# Patient Record
Sex: Female | Born: 1935 | Race: White | Hispanic: No | Smoking: Former smoker
Health system: Southern US, Community
[De-identification: ages and names within clinical notes are randomized; demographics above are authoritative.]

## PROBLEM LIST (undated history)

## (undated) DIAGNOSIS — N3941 Urge incontinence: Secondary | ICD-10-CM

## (undated) DIAGNOSIS — B351 Tinea unguium: Secondary | ICD-10-CM

## (undated) DIAGNOSIS — J40 Bronchitis, not specified as acute or chronic: Secondary | ICD-10-CM

## (undated) DIAGNOSIS — J438 Other emphysema: Secondary | ICD-10-CM

## (undated) DIAGNOSIS — I10 Essential (primary) hypertension: Secondary | ICD-10-CM

## (undated) HISTORY — PX: ABDOMINAL HYSTERECTOMY: SHX81

## (undated) HISTORY — PX: CHOLECYSTECTOMY: SHX55

---

## 2017-10-07 DIAGNOSIS — I10 Essential (primary) hypertension: Secondary | ICD-10-CM | POA: Insufficient documentation

## 2017-10-22 DIAGNOSIS — B351 Tinea unguium: Secondary | ICD-10-CM | POA: Insufficient documentation

## 2017-11-03 ENCOUNTER — Ambulatory Visit (INDEPENDENT_AMBULATORY_CARE_PROVIDER_SITE_OTHER): Payer: Medicare (Managed Care) | Admitting: Podiatry

## 2017-11-03 ENCOUNTER — Encounter: Payer: Self-pay | Admitting: Podiatry

## 2017-11-03 DIAGNOSIS — M79674 Pain in right toe(s): Secondary | ICD-10-CM | POA: Diagnosis not present

## 2017-11-03 DIAGNOSIS — B351 Tinea unguium: Secondary | ICD-10-CM | POA: Diagnosis not present

## 2017-11-03 DIAGNOSIS — M79675 Pain in left toe(s): Secondary | ICD-10-CM

## 2017-11-03 NOTE — Progress Notes (Signed)
   Subjective:    Patient ID: Anne Harrison, female    DOB: 01/18/1924, 84 y.o.   MRN: [id number]  HPIthis patient presents to the office for an evaluation and treatment of her painful  feet.  She [place]  She says the nails are long and thick and are painful walking and wearing her shoes.  She says it has been over 6 months since she was last evaluated and treated.  She says she is unable to self treat. She presents the office for preventative foot care services    Review of Systems  All other systems reviewed and are negative.      Objective:   Physical Exam General Appearance  Alert, conversant and in no acute stress.  Vascular  Dorsalis pedis and posterior pulses are palpable  bilaterally.  Capillary return is within normal limits  bilaterally. Temperature is within normal limits  Bilaterally.  Neurologic  Senn-Weinstein monofilament wire test within normal limits  bilaterally. Muscle power within normal limits bilaterally.  Nails Thick disfigured discolored nails with subungual debris bilaterally from hallux to fifth toes bilaterally. No evidence of bacterial infection or drainage bilaterally.  Orthopedic  No limitations of motion of motion feet bilaterally.  No crepitus or effusions noted.  No bony pathology or digital deformities noted.  Skin  normotropic skin with no porokeratosis noted bilaterally.  No signs of infections or ulcers noted.          Assessment & Plan:  Onychomycosis  B/L     IE  Debride nails  X 10.  RTC prn   Helane GuntherGregory Semaj Kham DPM

## 2018-08-05 DIAGNOSIS — N390 Urinary tract infection, site not specified: Secondary | ICD-10-CM | POA: Insufficient documentation

## 2018-08-05 DIAGNOSIS — N3941 Urge incontinence: Secondary | ICD-10-CM | POA: Insufficient documentation

## 2018-10-13 DIAGNOSIS — R059 Cough, unspecified: Secondary | ICD-10-CM | POA: Insufficient documentation

## 2018-10-13 DIAGNOSIS — J40 Bronchitis, not specified as acute or chronic: Secondary | ICD-10-CM | POA: Insufficient documentation

## 2018-11-27 ENCOUNTER — Emergency Department
Admission: EM | Admit: 2018-11-27 | Discharge: 2018-11-27 | Disposition: A | Discharge status: Skilled Nursing Facility | Payer: Medicare HMO | Attending: Emergency Medicine | Admitting: Emergency Medicine

## 2018-11-27 ENCOUNTER — Emergency Department: Payer: Medicare HMO

## 2018-11-27 ENCOUNTER — Other Ambulatory Visit: Payer: Self-pay

## 2018-11-27 DIAGNOSIS — Y9210 Unspecified residential institution as the place of occurrence of the external cause: Secondary | ICD-10-CM | POA: Insufficient documentation

## 2018-11-27 DIAGNOSIS — Z79899 Other long term (current) drug therapy: Secondary | ICD-10-CM | POA: Insufficient documentation

## 2018-11-27 DIAGNOSIS — I10 Essential (primary) hypertension: Secondary | ICD-10-CM | POA: Insufficient documentation

## 2018-11-27 DIAGNOSIS — W19XXXA Unspecified fall, initial encounter: Secondary | ICD-10-CM | POA: Insufficient documentation

## 2018-11-27 DIAGNOSIS — S0990XA Unspecified injury of head, initial encounter: Secondary | ICD-10-CM | POA: Insufficient documentation

## 2018-11-27 DIAGNOSIS — Y999 Unspecified external cause status: Secondary | ICD-10-CM | POA: Diagnosis not present

## 2018-11-27 DIAGNOSIS — I6203 Nontraumatic chronic subdural hemorrhage: Secondary | ICD-10-CM | POA: Diagnosis not present

## 2018-11-27 DIAGNOSIS — Y939 Activity, unspecified: Secondary | ICD-10-CM | POA: Insufficient documentation

## 2018-11-27 DIAGNOSIS — R51 Headache: Secondary | ICD-10-CM | POA: Diagnosis present

## 2018-11-27 DIAGNOSIS — Z87891 Personal history of nicotine dependence: Secondary | ICD-10-CM | POA: Diagnosis not present

## 2018-11-27 LAB — CBC WITH DIFFERENTIAL/PLATELET
Abs Immature Granulocytes: 0.04 10*3/uL (ref 0.00–0.07)
Basophils Absolute: 0 10*3/uL (ref 0.0–0.1)
Basophils Relative: 0 %
Eosinophils Absolute: 0 10*3/uL (ref 0.0–0.5)
Eosinophils Relative: 0 %
HCT: 41.3 % (ref 36.0–46.0)
Hemoglobin: 13.5 g/dL (ref 12.0–15.0)
Immature Granulocytes: 1 %
Lymphocytes Relative: 2 %
Lymphs Abs: 0.2 10*3/uL — ABNORMAL LOW (ref 0.7–4.0)
MCH: 30.5 pg (ref 26.0–34.0)
MCHC: 32.7 g/dL (ref 30.0–36.0)
MCV: 93.2 fL (ref 80.0–100.0)
Monocytes Absolute: 0.7 10*3/uL (ref 0.1–1.0)
Monocytes Relative: 10 %
Neutro Abs: 6.3 10*3/uL (ref 1.7–7.7)
Neutrophils Relative %: 87 %
Platelets: 166 10*3/uL (ref 150–400)
RBC: 4.43 MIL/uL (ref 3.87–5.11)
RDW: 13.8 % (ref 11.5–15.5)
WBC: 7.2 10*3/uL (ref 4.0–10.5)
nRBC: 0 % (ref 0.0–0.2)

## 2018-11-27 LAB — PROTIME-INR
INR: 0.95
Prothrombin Time: 12.6 seconds (ref 11.4–15.2)

## 2018-11-27 LAB — APTT: APTT: 43 s — AB (ref 24–36)

## 2018-11-27 NOTE — ED Notes (Signed)
Patient transported to X-ray 

## 2018-11-27 NOTE — ED Provider Notes (Addendum)
[facility] Emergency Department Provider Note  ____________________________________________   I have reviewed the triage vital signs and the nursing notes. Where available I have reviewed prior notes and, if possible and indicated, outside hospital notes.    HISTORY  Chief Complaint Fall    HPI Anne Harrison is a 85 y.o. female  who is not on blood thinners, had a non-syncopal fall.  Patient does have a history of a shunt, it was thought that she had a hematoma in the back of her head but it turns out that is where her shunt is.  She is at her baseline according to family.  She may have bumped her head, she does have chronic hip pain from exercises she has been doing, but nothing from the fall.  Family feels that she is "fine" but they do agree to imaging.  She did not have a syncopal event, she tripped.  Remembers falling remembers hitting the ground does not endorse passing out.  No other alleviating or aggravating symptoms no other prior treatment no other complaints     History reviewed. No pertinent past medical history.  Patient Active Problem List   Diagnosis Date Noted  . Onychomycosis 10/22/2017  . Essential hypertension 10/07/2017    History reviewed. No pertinent surgical history.  Prior to Admission medications   Medication Sig Start Date End Date Taking? Authorizing Provider  acetaminophen (TYLENOL) 325 MG tablet Take 325 mg by mouth every 4 (four) hours as needed for fever.   Yes [provider]  amLODipine (NORVASC) 5 MG tablet Take 5 mg by mouth daily.  10/22/17 11/27/18 Yes [provider]  Calcium-Phosphorus-Vitamin D (CITRACAL +D3) 250-107-500 MG-MG-UNIT CHEW Chew by mouth daily.   Yes [provider]  D-Mannose 500 MG CAPS Take 2 capsules by mouth at bedtime.   Yes [provider]  loperamide (ANTI-DIARRHEAL) 2 MG tablet Take 2 mg by mouth 4 (four) times daily as needed for diarrhea or loose stools.    Yes [provider]  metoprolol succinate (TOPROL-XL) 25 MG 24 hr tablet Take 25 mg by mouth at bedtime.  10/22/17 11/27/18 Yes [provider]  Multiple Vitamin (MULTIVITAMIN) capsule Take 2 capsules by mouth daily.    Yes [provider]    Allergies Ciprofloxacin  History reviewed. No pertinent family history.  Social History Social History   Tobacco Use  . Smoking status: Former Games developermoker  . Smokeless tobacco: Never Used  Substance Use Topics  . Alcohol use: Yes    Alcohol/week: 1.0 - 2.0 standard drinks    Types: 1 - 2 Glasses of wine per week    Comment: 1-2 glasses of wine per day  . Drug use: No    Review of Systems Constitutional: No fever/chills Eyes: No visual changes. ENT: No sore throat. No stiff neck no neck pain Cardiovascular: Denies chest pain. Respiratory: Denies shortness of breath. Gastrointestinal:   no vomiting.  No diarrhea.  No constipation. Genitourinary: Negative for dysuria. Musculoskeletal: Negative lower extremity swelling Skin: Negative for rash. Neurological: Negative for severe headaches, focal weakness or numbness.   ____________________________________________   PHYSICAL EXAM:  VITAL SIGNS: ED Triage Vitals  Enc Vitals Group     BP 11/27/18 1526 (!) 157/93     Pulse Rate 11/27/18 1526 93     Resp 11/27/18 1526 20     Temp 11/27/18 1526 98.6 F (37 C)     Temp Source 11/27/18 1526 Oral     SpO2 11/27/18  1526 96 %     Weight 11/27/18 1528 122 lb (55.3 kg)     Height 11/27/18 1528 5\' 4"  (1.626 m)     Head Circumference --      Peak Flow --      Pain Score 11/27/18 1528 5     Pain Loc --      Pain Edu? --      Excl. in GC? --     Constitutional: Alert and oriented. Well appearing and in no acute distress. Eyes: Conjunctivae are normal Head: Atraumatic, there is a shunt palpated but no trauma HEENT: No congestion/rhinnorhea. Mucous membranes are moist.  Oropharynx non-erythematous Neck:   Nontender  with no meningismus, no masses, no stridor Cardiovascular: Normal rate, regular rhythm. Grossly normal heart sounds.  Good peripheral circulation. Respiratory: Normal respiratory effort.  No retractions. Lungs CTAB. Abdominal: Soft and nontender. No distention. No guarding no rebound Back:  There is no focal tenderness or step off.  there is no midline tenderness there are no lesions noted. there is no CVA tenderness Musculoskeletal: No lower extremity tenderness, no upper extremity tenderness. No joint effusions, no DVT signs strong distal pulses no edema Neurologic:  Normal speech and language. No gross focal neurologic deficits are appreciated.  Skin:  Skin is warm, dry and intact. No rash noted. Psychiatric: Mood and affect are normal. Speech and behavior are normal.  ____________________________________________   LABS (all labs ordered are listed, but only abnormal results are displayed)  Labs Reviewed  CBC WITH DIFFERENTIAL/PLATELET - Abnormal; Notable for the following components:      Result Value   Lymphs Abs 0.2 (*)    All other components within normal limits  APTT - Abnormal; Notable for the following components:   aPTT 43 (*)    All other components within normal limits  PROTIME-INR    Pertinent labs  results that were available during my care of the patient were reviewed by me and considered in my medical decision making (see chart for details). ____________________________________________  EKG  I personally interpreted any EKGs ordered by me or triage  ____________________________________________  RADIOLOGY  Pertinent labs & imaging results that were available during my care of the patient were reviewed by me and considered in my medical decision making (see chart for details). If possible, patient and/or family made aware of any abnormal findings.  Ct Head Wo Contrast  Result Date: 11/27/2018 CLINICAL DATA:  Status post fall with a blow to the back of the  head. EXAM: CT HEAD WITHOUT CONTRAST TECHNIQUE: Contiguous axial images were obtained from the base of the skull through the vertex without intravenous contrast. COMPARISON:  None. FINDINGS: Brain: Patient's extra-axial fluid collection on the left measuring 0.8 cm in thickness. The collection is low attenuating but slightly more dense than CSF most consistent with a chronic subdural hematoma. No acute hemorrhage, infarct, midline shift or mass lesion is identified. Right parietal approach ventriculostomy shunt catheter is in place. No hydrocephalus. Atrophy and chronic microvascular ischemic change are noted. Vascular: Atherosclerosis is identified. Skull: Intact.  No focal lesion. Sinuses/Orbits: No acute abnormality paranasal sinuses clear. Other: None. IMPRESSION: No acute intracranial normality. Small, chronic left subdural hematoma without midline shift. Atrophy and chronic microvascular ischemic change. Ventriculostomy shunt catheter place.  Negative for hydrocephalus. Atherosclerosis. Electronically Signed   By: Drusilla Kanner M.D.   On: 11/27/2018 16:11   Dg Hip Unilat W Or Wo Pelvis 2-3 Views Left  Result Date: 11/27/2018 CLINICAL DATA:  Left  hip pain since beginning an exercise program. EXAM: DG HIP (WITH OR WITHOUT PELVIS) 2-3V LEFT COMPARISON:  None. FINDINGS: There is no evidence of hip fracture or dislocation. There is no evidence of arthropathy or other focal bone abnormality. IMPRESSION: Negative exam. Electronically Signed   By: Drusilla Kannerhomas  Dalessio M.D.   On: 11/27/2018 16:18   ____________________________________________    PROCEDURES  Procedure(s) performed: None  Procedures  Critical Care performed: None  ____________________________________________   INITIAL IMPRESSION / ASSESSMENT AND PLAN / ED COURSE  Pertinent labs & imaging results that were available during my care of the patient were reviewed by me and considered in my medical decision making (see chart for  details).  Well-appearing patient after a non-syncopal fall, CT scan shows a questionable hygroma, versus old bleed but no evidence of acute bleed.  For this reason, I did do a CT basic blood work, to make sure her platelets etc. are okay which they are.  I will discuss with neurosurgery but is my hope that we can get her safely home.  Pending neurosurgical consult  ----------------------------------------- 6:23 PM on 11/27/2018 -----------------------------------------  Patient remains alert and awake, I did discuss all of her findings with Dr. Renee RivalHouck, of neurosurgery, he does not feel further imaging or work-up is important at this time but he does recommend outpatient follow-up as a PRN basis.  We will do so.  Family and she are eager to go home we will discharge.  Remains neurologically intact return precautions were given and understood.    ____________________________________________   FINAL CLINICAL IMPRESSION(S) / ED DIAGNOSES  Final diagnoses:  None      This chart was dictated using voice recognition software.  Despite best efforts to proofread,  errors can occur which can change meaning.      Jeanmarie PlantMcShane, Parilee Hally A, MD 11/27/18 1742    Jeanmarie PlantMcShane, Soley Harriss A, MD 11/27/18 [phone]

## 2018-11-27 NOTE — ED Triage Notes (Signed)
Pt arrived via [facility] from [facility] assisted living with a mechanical fall. Pt has hematoma to the right back of her head and is c/o of some left hip tenderness. No LOC and no blood thinners. Pt pleasant and calm upon arrival.

## 2018-11-27 NOTE — Discharge Instructions (Addendum)
There may be a trace of old, not active blood on your CT scan.  Please follow closely with Dr. listed above.  Avoid anticoagulation medications which would include aspirin and ibuprofen, until they see you.  If you have severe headache numbness or weakness or other complaints or concerns return to the emergency department

## 2019-12-28 ENCOUNTER — Encounter: Payer: Self-pay | Admitting: Intensive Care

## 2019-12-28 ENCOUNTER — Emergency Department
Admission: EM | Admit: 2019-12-28 | Discharge: 2019-12-28 | Disposition: A | Discharge status: Home / Self Care | Payer: Medicare Other | Attending: Student | Admitting: Student

## 2019-12-28 ENCOUNTER — Emergency Department: Payer: Medicare Other

## 2019-12-28 ENCOUNTER — Other Ambulatory Visit: Payer: Self-pay

## 2019-12-28 DIAGNOSIS — M25551 Pain in right hip: Secondary | ICD-10-CM | POA: Insufficient documentation

## 2019-12-28 DIAGNOSIS — W19XXXA Unspecified fall, initial encounter: Secondary | ICD-10-CM

## 2019-12-28 DIAGNOSIS — W010XXA Fall on same level from slipping, tripping and stumbling without subsequent striking against object, initial encounter: Secondary | ICD-10-CM | POA: Diagnosis not present

## 2019-12-28 DIAGNOSIS — Z79899 Other long term (current) drug therapy: Secondary | ICD-10-CM | POA: Diagnosis not present

## 2019-12-28 DIAGNOSIS — Z87891 Personal history of nicotine dependence: Secondary | ICD-10-CM | POA: Diagnosis not present

## 2019-12-28 DIAGNOSIS — I10 Essential (primary) hypertension: Secondary | ICD-10-CM | POA: Diagnosis not present

## 2019-12-28 DIAGNOSIS — R519 Headache, unspecified: Secondary | ICD-10-CM | POA: Diagnosis not present

## 2019-12-28 HISTORY — DX: Urge incontinence: N39.41

## 2019-12-28 HISTORY — DX: Essential (primary) hypertension: I10

## 2019-12-28 HISTORY — DX: Other emphysema: J43.8

## 2019-12-28 HISTORY — DX: Bronchitis, not specified as acute or chronic: J40

## 2019-12-28 HISTORY — DX: Tinea unguium: B35.1

## 2019-12-28 NOTE — ED Provider Notes (Signed)
[facility] Emergency Department Provider Note  ____________________________________________   First MD Initiated Contact with Patient 12/28/19 1408     (approximate)  I have reviewed the triage vital signs and the nursing notes.  History  Chief Complaint Fall and Hip Pain    HPI Anne Harrison is a 86 y.o. female who presents from her independent living facility for a fall. Patient states she was in the restroom reaching for some soap when she slipped and fell to the ground. Landed on her right side. Was able to get up and ambulate afterwards. Complains of primarily of right hip pain and mild right forehead/side head pain. Pain located to the lateral R hip area, sharp/aching, 8/10 in severity. No radiation. No alleviating or aggravating components. Denies any LOC. Not on any anticoagulation. Denies any chest pain, palpitations, dizziness, or shortness of breath. No LE weakness, numbness, tingling. No sick contacts.   Does have hx of chronic left subdural hematoma. 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 No acute changes with regards to either per daughter (discussed via phone).    Past Medical Hx Past Medical History:  Diagnosis Date  . Bronchitis, not specified as acute or chronic   . Essential (primary) hypertension   . Other emphysema ([place])   . Tinea unguium   . Urge incontinence     Problem List Patient Active Problem List   Diagnosis Date Noted  . Onychomycosis 10/22/2017  . Essential hypertension 10/07/2017    Past Surgical Hx History reviewed. No pertinent surgical history.  Medications Prior to Admission medications   Medication Sig Start Date End Date Taking? Authorizing Provider  acetaminophen (TYLENOL) 325 MG tablet Take 325 mg by mouth every 4 (four) hours as needed for fever.    [provider]  amLODipine (NORVASC) 5 MG tablet Take 5 mg by mouth daily.  10/22/17 11/27/18  [provider]   Calcium-Phosphorus-Vitamin D ([place] +D3) [phone] MG-MG-UNIT CHEW Chew by mouth daily.    [provider]  D-Mannose 500 MG CAPS Take 2 capsules by mouth at bedtime.    [provider]  loperamide (ANTI-DIARRHEAL) 2 MG tablet Take 2 mg by mouth 4 (four) times daily as needed for diarrhea or loose stools.    [provider]  metoprolol succinate (TOPROL-XL) 25 MG 24 hr tablet Take 25 mg by mouth at bedtime.  10/22/17 11/27/18  [provider]  Multiple Vitamin (MULTIVITAMIN) capsule Take 2 capsules by mouth daily.     [provider]    Allergies Ciprofloxacin  Family Hx History reviewed. No pertinent family history.  Social Hx Social History   Tobacco Use  . Smoking status: Former Research scientist (life sciences)  . Smokeless tobacco: Never Used  Substance Use Topics  . Alcohol use: Yes    Alcohol/week: 1.0 - 2.0 standard drinks    Types: 1 - 2 Glasses of wine per week    Comment: 1-2 glasses of wine per day  . Drug use: No     Review of Systems  Constitutional: Negative for fever, chills. + R forehead pain Eyes: Negative for visual changes. ENT: Negative for sore throat. Cardiovascular: Negative for chest pain. Respiratory: Negative for shortness of breath. Gastrointestinal: Negative for nausea, vomiting.  Genitourinary: Negative for dysuria. Musculoskeletal: + R hip pain Skin: Negative for rash. Neurological: Negative for headaches.   Physical Exam  Vital Signs: ED Triage Vitals  Enc Vitals Group     BP 12/28/19  1400 (!) 178/104     Pulse Rate 12/28/19 1400 75     Resp 12/28/19 1400 18     Temp 12/28/19 1407 98.2 F (36.8 C)     Temp Source 12/28/19 1407 Oral     SpO2 12/28/19 1400 97 %     Weight 12/28/19 1400 110 lb (49.9 kg)     Height 12/28/19 1400 5\' 4"  (1.626 m)     Head Circumference --      Peak Flow --      Pain Score 12/28/19 1400 8     Pain Loc --      Pain Edu? --      Excl. in GC? --     Constitutional: Alert and  oriented.  Head: Normocephalic.  No appreciable large hematomas or abrasions or lacerations. Eyes: Conjunctivae clear. Sclera anicteric. Nose: No epistaxis. Mouth/Throat: Wearing mask.  Neck: No stridor.  No midline CS tenderness. Cardiovascular: Normal rate, regular rhythm. Extremities well perfused. Chest: Chest wall stable, nontender.  No crepitance or step-offs. Respiratory: Normal respiratory effort.  Lungs CTAB. Gastrointestinal: Soft. Non-tender. Non-distended.  Pelvis: Pelvis is stable with AP and lateral compression.  FROM bilateral hips.  TTP right lateral hip over the greater trochanter area. Musculoskeletal: No lower extremity edema. No deformities.  FROM bilateral shoulders, elbows, wrists, hips, knees, ankles. Neurologic:  Normal speech and language. No gross focal neurologic deficits are appreciated.  Skin: Skin is warm, dry and intact. No lacerations or abrasions. Psychiatric: Mood and affect are appropriate for situation.  EKG  Personally reviewed.   Rate: 66 Rhythm: sinus Axis: normal Intervals: WNL No acute ischemic changes No STEMI    Radiology  CXR: IMPRESSION:  No active cardiopulmonary disease.   XR R hip: IMPRESSION:  Negative.   CT head: IMPRESSION:  No evidence of acute intracranial injury.  Stable positioning of shunt catheter. Ventricle caliber has slightly  increased.  Decrease in overall size of chronic left cerebral convexity subdural  collection.  Stable chronic microvascular ischemic changes.   CT CS: IMPRESSION:  No acute cervical spine fracture.    Procedures  Procedure(s) performed (including critical care):  Procedures   Initial Impression / Assessment and Plan / ED Course  86 y.o. female who presents to the ED for fall, as above. Complains of R hip and R forehead pain.  Will obtain imaging to r/o any acute traumatic injuries.  XR and CT imaging negative for acute traumatic injuries. Decreased in size of chronic  subdural. Stable shunt catheter. Discussed results with patient, as well as daughter via phone. Will plan for ambulatory trial and then discharge presuming no issues.    Final Clinical Impression(s) / ED Diagnosis  Final diagnoses:  Fall, initial encounter       Note:  This document was prepared using Dragon voice recognition software and may include unintentional dictation errors.   11-23-1968., MD 12/28/19 [phone]

## 2019-12-28 NOTE — Discharge Instructions (Signed)
Thank you for letting us take care of you in the emergency department today.   Please continue to take any regular, prescribed medications.   Please follow up with: - Your primary care doctor to review your ER visit  Please return to the ER for any new or worsening symptoms.

## 2019-12-28 NOTE — ED Triage Notes (Signed)
PAtient arrived by EMS from [facility] assisted living for unwitnessed fall. Patient c/o right head pain and right hip pain.

## 2019-12-28 NOTE — ED Notes (Signed)
Pt returning home via POV with daughter. Discharge papers and DNR form given to daughter.

## 2019-12-28 NOTE — ED Notes (Signed)
Pt ambulated with walker to bathroom. Pt had been incontinent of urine in bed. Linen changed. Pt provided dry brief. Pt urinated into toilet and ambulated back with walker. Pt dressed and asking when she can leave.

## 2019-12-28 NOTE — ED Notes (Signed)
Pt transported to Xray. 

## 2020-02-20 ENCOUNTER — Emergency Department: Payer: Medicare Other

## 2020-02-20 ENCOUNTER — Other Ambulatory Visit: Payer: Self-pay

## 2020-02-20 ENCOUNTER — Emergency Department
Admission: EM | Admit: 2020-02-20 | Discharge: 2020-02-20 | Disposition: A | Discharge status: Skilled Nursing Facility | Payer: Medicare Other | Attending: Emergency Medicine | Admitting: Emergency Medicine

## 2020-02-20 DIAGNOSIS — I10 Essential (primary) hypertension: Secondary | ICD-10-CM | POA: Insufficient documentation

## 2020-02-20 DIAGNOSIS — S0990XA Unspecified injury of head, initial encounter: Secondary | ICD-10-CM | POA: Diagnosis present

## 2020-02-20 DIAGNOSIS — Y999 Unspecified external cause status: Secondary | ICD-10-CM | POA: Diagnosis not present

## 2020-02-20 DIAGNOSIS — Y92129 Unspecified place in nursing home as the place of occurrence of the external cause: Secondary | ICD-10-CM | POA: Diagnosis not present

## 2020-02-20 DIAGNOSIS — W19XXXA Unspecified fall, initial encounter: Secondary | ICD-10-CM

## 2020-02-20 DIAGNOSIS — W01198A Fall on same level from slipping, tripping and stumbling with subsequent striking against other object, initial encounter: Secondary | ICD-10-CM | POA: Insufficient documentation

## 2020-02-20 DIAGNOSIS — Z87891 Personal history of nicotine dependence: Secondary | ICD-10-CM | POA: Diagnosis not present

## 2020-02-20 DIAGNOSIS — Y9389 Activity, other specified: Secondary | ICD-10-CM | POA: Insufficient documentation

## 2020-02-20 DIAGNOSIS — G912 (Idiopathic) normal pressure hydrocephalus: Secondary | ICD-10-CM | POA: Diagnosis not present

## 2020-02-20 NOTE — ED Notes (Signed)
Pt taken to CT.

## 2020-02-20 NOTE — ED Provider Notes (Signed)
[facility] Emergency Department Provider Note   ____________________________________________   First MD Initiated Contact with Patient 02/20/20 1817     (approximate)  I have reviewed the triage vital signs and the nursing notes.   HISTORY  Chief Complaint Fall    HPI Anne Harrison is a 86 y.o. female with past medical history of normal pressure hydrocephalus status post VP shunt who presents to the ED following fall.  Patient reports that she was going to rise from a seated position when she lost her balance, falling backwards and hitting her head.  She denies losing consciousness and has been feeling well since the fall, denies any headache or other pain.  She also denies any vision changes, speech changes, numbness, or weakness.  She does not take any blood thinners.        Past Medical History:  Diagnosis Date  . Bronchitis, not specified as acute or chronic   . Essential (primary) hypertension   . Other emphysema ([facility])   . Tinea unguium   . Urge incontinence     Patient Active Problem List   Diagnosis Date Noted  . Onychomycosis 10/22/2017  . Essential hypertension 10/07/2017    History reviewed. No pertinent surgical history.  Prior to Admission medications   Medication Sig Start Date End Date Taking? Authorizing Provider  acetaminophen (TYLENOL) 325 MG tablet Take 325 mg by mouth every 4 (four) hours as needed for fever.    [provider]  amLODipine (NORVASC) 5 MG tablet Take 5 mg by mouth daily.  10/22/17 11/27/18  [provider]  Calcium-Phosphorus-Vitamin D (CITRACAL +D3) 250-107-500 MG-MG-UNIT CHEW Chew by mouth daily.    [provider]  D-Mannose 500 MG CAPS Take 2 capsules by mouth at bedtime.    [provider]  loperamide (ANTI-DIARRHEAL) 2 MG tablet Take 2 mg by mouth 4 (four) times daily as needed for diarrhea or loose stools.    [provider]  metoprolol succinate  (TOPROL-XL) 25 MG 24 hr tablet Take 25 mg by mouth at bedtime.  10/22/17 11/27/18  [provider]  Multiple Vitamin (MULTIVITAMIN) capsule Take 2 capsules by mouth daily.     [provider]    Allergies Ciprofloxacin  No family history on file.  Social History Social History   Tobacco Use  . Smoking status: Former Games developer  . Smokeless tobacco: Never Used  Substance Use Topics  . Alcohol use: Yes    Alcohol/week: 1.0 - 2.0 standard drinks    Types: 1 - 2 Glasses of wine per week    Comment: 1-2 glasses of wine per day  . Drug use: No    Review of Systems  Constitutional: No fever/chills.  Positive for fall. Eyes: No visual changes. ENT: No sore throat. Cardiovascular: Denies chest pain. Respiratory: Denies shortness of breath. Gastrointestinal: No abdominal pain.  No nausea, no vomiting.  No diarrhea.  No constipation. Genitourinary: Negative for dysuria. Musculoskeletal: Negative for back pain. Skin: Negative for rash. Neurological: Negative for headaches, focal weakness or numbness.  ____________________________________________   PHYSICAL EXAM:  VITAL SIGNS: ED Triage Vitals [02/20/20 1817]  Enc Vitals Group     BP      Pulse      Resp      Temp      Temp src      SpO2      Weight 89 lb (40.4 kg)     Height 5\' 4"  (1.626 m)  Head Circumference      Peak Flow      Pain Score      Pain Loc      Pain Edu?      Excl. in [place]?     Constitutional: Alert and oriented. Eyes: Conjunctivae are normal. Head: Atraumatic. Nose: No congestion/rhinnorhea. Mouth/Throat: Mucous membranes are moist. Neck: Normal ROM Cardiovascular: Normal rate, regular rhythm. Grossly normal heart sounds. Respiratory: Normal respiratory effort.  No retractions. Lungs CTAB. Gastrointestinal: Soft and nontender. No distention. Genitourinary: deferred Musculoskeletal: No lower extremity tenderness nor edema. Neurologic:  Normal speech and language. No gross focal  neurologic deficits are appreciated. Skin:  Skin is warm, dry and intact. No rash noted. Psychiatric: Mood and affect are normal. Speech and behavior are normal.  ____________________________________________   LABS (all labs ordered are listed, but only abnormal results are displayed)  Labs Reviewed - No data to display ____________________________________________  EKG  ED ECG REPORT I, Blake Divine, the attending physician, personally viewed and interpreted this ECG.   Date: 02/20/2020  EKG Time: 18:19  Rate: 76  Rhythm: normal sinus rhythm  Axis: Normal  Intervals:none  ST&T Change: None   PROCEDURES  Procedure(s) performed (including Critical Care):  Procedures   ____________________________________________   INITIAL IMPRESSION / ASSESSMENT AND PLAN / ED COURSE       86 year old female with history of normal pressure hydrocephalus status post VP shunt presents to the ED for fall where she fell backwards and struck her head.  She is at her baseline mental status with no focal neurologic deficits.  CT scan was obtained and negative for acute process, shows appropriate positioning of shunt.  Patient continues to deny any complaints and is appropriate for discharge home.      ____________________________________________   FINAL CLINICAL IMPRESSION(S) / ED DIAGNOSES  Final diagnoses:  Fall, initial encounter  NPH (normal pressure hydrocephalus) [facility])     ED Discharge Orders    None       Note:  This document was prepared using Dragon voice recognition software and may include unintentional dictation errors.   Blake Divine, MD 02/20/20 [phone]

## 2020-02-20 NOTE — ED Triage Notes (Signed)
Pt brought to ED via [facility] from [facility] for mechanical fall over walker onto floor. Pt hit head with fall, no obvious injury. Pt denies pain. Denies neck pain.

## 2020-07-09 ENCOUNTER — Ambulatory Visit
Admission: EM | Admit: 2020-07-09 | Discharge: 2020-07-09 | Disposition: A | Discharge status: Home / Self Care | Payer: Medicare Other | Attending: Emergency Medicine | Admitting: Emergency Medicine

## 2020-07-09 ENCOUNTER — Ambulatory Visit (INDEPENDENT_AMBULATORY_CARE_PROVIDER_SITE_OTHER): Payer: Medicare Other

## 2020-07-09 ENCOUNTER — Other Ambulatory Visit: Payer: Self-pay

## 2020-07-09 DIAGNOSIS — S7002XA Contusion of left hip, initial encounter: Secondary | ICD-10-CM | POA: Diagnosis not present

## 2020-07-09 DIAGNOSIS — R079 Chest pain, unspecified: Secondary | ICD-10-CM

## 2020-07-09 DIAGNOSIS — S298XXA Other specified injuries of thorax, initial encounter: Secondary | ICD-10-CM

## 2020-07-09 DIAGNOSIS — W19XXXA Unspecified fall, initial encounter: Secondary | ICD-10-CM

## 2020-07-09 DIAGNOSIS — M25559 Pain in unspecified hip: Secondary | ICD-10-CM

## 2020-07-09 DIAGNOSIS — R0781 Pleurodynia: Secondary | ICD-10-CM

## 2020-07-09 DIAGNOSIS — M25552 Pain in left hip: Secondary | ICD-10-CM

## 2020-07-09 MED ORDER — ACETAMINOPHEN 500 MG PO TABS
500.0000 mg | ORAL_TABLET | Freq: Once | ORAL | Status: AC
  Administered 2020-07-09: 500 mg via ORAL

## 2020-07-09 NOTE — ED Triage Notes (Signed)
Patient presents to [facility] with daughter. States that patient fell around 1230pm outside her room at [facility] and fell onto a carpeted floor. Patient now having left hip pain and left leg pain. Patient states that she did hit her head but that doesn't hurt.

## 2020-07-09 NOTE — Discharge Instructions (Addendum)
Her hip x-ray is negative for fracture or dislocation and her chest x-ray is negative for rib fractures or collapsed lung.  I suspect that she is just very sore from the fall.  May give 500 mg of Tylenol 3-4 times a day as needed for pain.  Ice to the areas of pain the first 72 hours, then warmth thereafter.

## 2020-07-09 NOTE — ED Provider Notes (Signed)
HPI  SUBJECTIVE:  Anne Harrison is a 86 y.o. female who presents with left hip pain after having a witnessed mechanical fall this afternoon at 1230.  Daughter states that the patient was walking out of the bathroom with her pants around her ankles, lost her balance, fell landing on her left leg, hip, and left chest.  Patient does not exactly remember how she fell.daughter reports that she "brushed" her left face on the floor.  She did not not hit her head.  No loss of consciousness, altered mental status, headache, facial droop, slurred speech, arm or leg weakness.  No preceding chest pain, shortness of breath, palpitations, syncope causing her to fall.  She denies knee pain.  She reports limitation of motion of the left leg.  She can bear some weight on it although patient states that it is painful.  Patient has a past medical history of hypertension, UTI, "memory issues".  No history of dementia, Alzheimer's, osteoporosis, liver disease.  Patient with memory problems.  Most history obtained from daughter.   Past Medical History:  Diagnosis Date  . Bronchitis, not specified as acute or chronic   . Essential (primary) hypertension   . Other emphysema (HCC)   . Tinea unguium   . Urge incontinence     History reviewed. No pertinent surgical history.  History reviewed. No pertinent family history.  Social History   Tobacco Use  . Smoking status: Former Games developer  . Smokeless tobacco: Never Used  Vaping Use  . Vaping Use: Never used  Substance Use Topics  . Alcohol use: Yes    Alcohol/week: 1.0 - 2.0 standard drink    Types: 1 - 2 Glasses of wine per week    Comment: 1-2 glasses of wine per day  . Drug use: No    No current facility-administered medications for this encounter.  Current Outpatient Medications:  .  amLODipine (NORVASC) 5 MG tablet, Take 5 mg by mouth daily. , Disp: , Rfl:  .  Calcium-Phosphorus-Vitamin D (CITRACAL +D3) 250-107-500 MG-MG-UNIT CHEW, Chew by mouth  daily., Disp: , Rfl:  .  D-Mannose 500 MG CAPS, Take 2 capsules by mouth at bedtime., Disp: , Rfl:  .  loperamide (ANTI-DIARRHEAL) 2 MG tablet, Take 2 mg by mouth 4 (four) times daily as needed for diarrhea or loose stools., Disp: , Rfl:  .  metoprolol succinate (TOPROL-XL) 25 MG 24 hr tablet, Take 25 mg by mouth at bedtime. , Disp: , Rfl:  .  Multiple Vitamin (MULTIVITAMIN) capsule, Take 2 capsules by mouth daily. , Disp: , Rfl:  .  acetaminophen (TYLENOL) 325 MG tablet, Take 325 mg by mouth every 4 (four) hours as needed for fever., Disp: , Rfl:   Allergies  Allergen Reactions  . Ciprofloxacin Hives     ROS  As noted in HPI.   Physical Exam  BP (!) 147/106 (BP Location: Left Arm)   Pulse 88   Temp 98.7 F (37.1 C) (Oral)   Resp 18   Wt 40 kg   SpO2 93%   BMI 15.14 kg/m   Constitutional: Well developed, well nourished, appears uncomfortable Eyes: PERRL, EOMI, conjunctiva normal bilaterally HENT: Normocephalic, atraumatic,mucus membranes moist.  No hemotympanum. Respiratory: Clear to auscultation bilaterally, no rales, no wheezing, no rhonchi.  Positive left-sided chest wall tenderness lateral breast.  No bruising. Cardiovascular: Normal rate and rhythm, no murmurs, no gallops, no rubs GI: Soft, nondistended, normal bowel sounds, nontender, no rebound, no guarding Back: no C spine, T-spine, L-spine tenderness.  skin: No rash, skin intact Musculoskeletal: Legs symmetric length, left leg is in neutral position.  No bruising left hip.  Positive tenderness over greater trochanter.  No bruising.  Positive limited active range of motion at the hip.  Pain with passive range of motion of the hip.  No tenderness over the femur, knee.  Pelvis stable, nontender.  Sensation distal left lower extremity intact.  PT 2+. Neurologic: Alert, CN III-XII grossly intact, no motor deficits, sensation grossly intact Psychiatric: Speech and behavior appropriate   ED Course   Medications   acetaminophen (TYLENOL) tablet 500 mg (500 mg Oral Given 07/09/20 1625)    Orders Placed This Encounter  Procedures  . DG Hip Unilat With Pelvis 2-3 Views Left    Standing Status:   Standing    Number of Occurrences:   1    Order Specific Question:   Symptom/Reason for Exam    Answer:   Hip pain [[id number]]    Order Specific Question:   Radiology Contrast Protocol - do NOT remove file path    Answer:   [email or web address]  . DG Ribs Unilateral W/Chest Left    Standing Status:   Standing    Number of Occurrences:   1    Order Specific Question:   Reason for Exam (SYMPTOM  OR DIAGNOSIS REQUIRED)    Answer:   fall mid chest tenderness   No results found for this or any previous visit (from the past 24 hour(s)). DG Ribs Unilateral W/Chest Left  Result Date: 07/09/2020 CLINICAL DATA:  Pain status post fall EXAM: LEFT RIBS AND CHEST - 3+ VIEW COMPARISON:  December 28, 2019 FINDINGS: A right-sided VP shunt is partially visualized. There is no displaced rib fracture. No pneumothorax. The heart size is unremarkable. Aortic calcifications are noted. IMPRESSION: No displaced rib fracture. No pneumothorax. Electronically Signed   By: Katherine Mantle M.D.   On: 07/09/2020 16:24   DG Hip Unilat With Pelvis 2-3 Views Left  Result Date: 07/09/2020 CLINICAL DATA:  Pain EXAM: DG HIP (WITH OR WITHOUT PELVIS) 2-3V LEFT COMPARISON:  November 27, 2018 FINDINGS: There are degenerative changes of both hips. There is diffuse osteopenia. There is no acute displaced fracture or dislocation. IMPRESSION: Negative. Electronically Signed   By: Katherine Mantle M.D.   On: 07/09/2020 16:17    ED Clinical Impression  1. Contusion of left hip, initial encounter   2. Hip pain   3. Blunt trauma to chest, initial encounter   4. Fall, initial encounter      ED Assessment/Plan   Patient and daughter deny head direct trauma.  Daughter and patient states that she may  have brushed her face against the floor, but patient is acting normally, discussed getting a head CT with daughter, she agrees to defer that today.  Patient does have left chest tenderness and left hip tenderness/very limited range of motion.  Will x-ray hip and chest rule out hip fracture, rib fracture.  Giving 500 mg of Tylenol for pain.  Imaging independently reviewed.  No displaced rib fracture, pneumothorax.  No acute displaced fracture or dislocation of the hip.  Osteopenia.  See radiology report for details  On reevaluation, patient states that she feels better.  She is moving her left lower extremity more per daughter.  Patient with contusion of the hip/left chest.  X-rays do not show any fractures.  Home with Tylenol, ice.  Follow-up with PMD in several days, strict ER return precautions given.  Discussed  imaging, MDM,  treatment plan, and plan for follow-up with daughter and patient Discussed sn/sx that should prompt return to the ED. daughter agrees with plan.   Meds ordered this encounter  Medications  . acetaminophen (TYLENOL) tablet 500 mg    *This clinic note was created using Scientist, clinical (histocompatibility and immunogenetics). Therefore, there may be occasional mistakes despite careful proofreading.  ?    Domenick Gong, MD 07/10/20 [id number]

## 2020-07-11 ENCOUNTER — Other Ambulatory Visit: Payer: Self-pay

## 2020-07-11 ENCOUNTER — Emergency Department: Payer: Medicare Other

## 2020-07-11 ENCOUNTER — Emergency Department
Admission: EM | Admit: 2020-07-11 | Discharge: 2020-07-11 | Disposition: A | Discharge status: Home / Self Care | Payer: Medicare Other | Attending: Emergency Medicine | Admitting: Emergency Medicine

## 2020-07-11 DIAGNOSIS — M25562 Pain in left knee: Secondary | ICD-10-CM | POA: Diagnosis not present

## 2020-07-11 DIAGNOSIS — Y9389 Activity, other specified: Secondary | ICD-10-CM | POA: Insufficient documentation

## 2020-07-11 DIAGNOSIS — R519 Headache, unspecified: Secondary | ICD-10-CM | POA: Diagnosis not present

## 2020-07-11 DIAGNOSIS — Z79899 Other long term (current) drug therapy: Secondary | ICD-10-CM | POA: Insufficient documentation

## 2020-07-11 DIAGNOSIS — I10 Essential (primary) hypertension: Secondary | ICD-10-CM | POA: Diagnosis not present

## 2020-07-11 DIAGNOSIS — R0602 Shortness of breath: Secondary | ICD-10-CM | POA: Diagnosis not present

## 2020-07-11 DIAGNOSIS — Y999 Unspecified external cause status: Secondary | ICD-10-CM | POA: Insufficient documentation

## 2020-07-11 DIAGNOSIS — W06XXXA Fall from bed, initial encounter: Secondary | ICD-10-CM | POA: Insufficient documentation

## 2020-07-11 DIAGNOSIS — Y92009 Unspecified place in unspecified non-institutional (private) residence as the place of occurrence of the external cause: Secondary | ICD-10-CM | POA: Insufficient documentation

## 2020-07-11 DIAGNOSIS — Z87891 Personal history of nicotine dependence: Secondary | ICD-10-CM | POA: Diagnosis not present

## 2020-07-11 DIAGNOSIS — M25552 Pain in left hip: Secondary | ICD-10-CM | POA: Diagnosis not present

## 2020-07-11 DIAGNOSIS — M545 Low back pain: Secondary | ICD-10-CM | POA: Insufficient documentation

## 2020-07-11 DIAGNOSIS — W19XXXA Unspecified fall, initial encounter: Secondary | ICD-10-CM

## 2020-07-11 LAB — LACTIC ACID, PLASMA: Lactic Acid, Venous: 1 mmol/L (ref 0.5–1.9)

## 2020-07-11 LAB — CBC WITH DIFFERENTIAL/PLATELET
Abs Immature Granulocytes: 0.04 10*3/uL (ref 0.00–0.07)
Basophils Absolute: 0 10*3/uL (ref 0.0–0.1)
Basophils Relative: 0 %
Eosinophils Absolute: 0.1 10*3/uL (ref 0.0–0.5)
Eosinophils Relative: 1 %
HCT: 37 % (ref 36.0–46.0)
Hemoglobin: 12.4 g/dL (ref 12.0–15.0)
Immature Granulocytes: 0 %
Lymphocytes Relative: 7 %
Lymphs Abs: 0.7 10*3/uL (ref 0.7–4.0)
MCH: 30.6 pg (ref 26.0–34.0)
MCHC: 33.5 g/dL (ref 30.0–36.0)
MCV: 91.4 fL (ref 80.0–100.0)
Monocytes Absolute: 1.7 10*3/uL — ABNORMAL HIGH (ref 0.1–1.0)
Monocytes Relative: 18 %
Neutro Abs: 7 10*3/uL (ref 1.7–7.7)
Neutrophils Relative %: 74 %
Platelets: 172 10*3/uL (ref 150–400)
RBC: 4.05 MIL/uL (ref 3.87–5.11)
RDW: 13.8 % (ref 11.5–15.5)
WBC: 9.5 10*3/uL (ref 4.0–10.5)
nRBC: 0 % (ref 0.0–0.2)

## 2020-07-11 LAB — TROPONIN I (HIGH SENSITIVITY)
Troponin I (High Sensitivity): 48 ng/L — ABNORMAL HIGH (ref <18)
Troponin I (High Sensitivity): 50 ng/L — ABNORMAL HIGH (ref <18)

## 2020-07-11 LAB — COMPREHENSIVE METABOLIC PANEL
ALT: 14 U/L (ref 0–44)
AST: 21 U/L (ref 15–41)
Albumin: 3.9 g/dL (ref 3.5–5.0)
Alkaline Phosphatase: 81 U/L (ref 38–126)
Anion gap: 12 (ref 5–15)
BUN: 18 mg/dL (ref 8–23)
CO2: 26 mmol/L (ref 22–32)
Calcium: 9.3 mg/dL (ref 8.9–10.3)
Chloride: 97 mmol/L — ABNORMAL LOW (ref 98–111)
Creatinine, Ser: 0.79 mg/dL (ref 0.44–1.00)
GFR calc Af Amer: >60 mL/min (ref >60)
GFR calc non Af Amer: >60 mL/min (ref >60)
Glucose, Bld: 101 mg/dL — ABNORMAL HIGH (ref 70–99)
Potassium: 4 mmol/L (ref 3.5–5.1)
Sodium: 135 mmol/L (ref 135–145)
Total Bilirubin: 1.4 mg/dL — ABNORMAL HIGH (ref 0.3–1.2)
Total Protein: 7.4 g/dL (ref 6.5–8.1)

## 2020-07-11 LAB — CK: Total CK: 57 U/L (ref 38–234)

## 2020-07-11 NOTE — Discharge Instructions (Addendum)
Please return for any further problems especially any chest pain or increasing shortness of breath.  Please follow-up with your regular doctor.

## 2020-07-11 NOTE — ED Provider Notes (Signed)
[facility] Emergency Department Provider Note   ____________________________________________   First MD Initiated Contact with Patient 07/11/20 1516     (approximate)  I have reviewed the triage vital signs and the nursing notes.   HISTORY  Chief Complaint Fall and Shortness of Breath   HPI Anne Harrison is a 85 y.o. female who reports she slipped out of bed.  Fall was unwitnessed.  She says she hurt her back.  She has a little bit of mild back pain on the left paraspinous muscle area.  There is no bruising or abrasions.  Minimal spinal tenderness.  Patient can sit up and lay back without much trouble.  Daughter is with her.  Patient lives with the daughter.  Patient's daughter reports that her O2 sats are usually 89-91.  She smoked for many years.  Patient does not complain of shortness of breath.  She is not having any coughing.  Her chest x-ray is clear and her lungs are clear on exam.  Discussed head CT with the daughter.  Daughter really does not want another head CT is a patient just had one recently.  Patient does not remember if she fell and hit her head or not but there are no marks or bruises on her head no headache no nausea no double vision.  Daughter understands there is a risk of missing bleeding in the head but she is okay with this risk and patient is as well.         Past Medical History:  Diagnosis Date  . Bronchitis, not specified as acute or chronic   . Essential (primary) hypertension   . Other emphysema (HCC)   . Tinea unguium   . Urge incontinence     Patient Active Problem List   Diagnosis Date Noted  . Onychomycosis 10/22/2017  . Essential hypertension 10/07/2017    History reviewed. No pertinent surgical history.  Prior to Admission medications   Medication Sig Start Date End Date Taking? Authorizing Provider  acetaminophen (TYLENOL) 325 MG tablet Take 325 mg by mouth every 4 (four) hours as needed for fever.     [provider]  amLODipine (NORVASC) 5 MG tablet Take 5 mg by mouth daily.  10/22/17 07/09/20  [provider]  Calcium-Phosphorus-Vitamin D (CITRACAL +D3) 250-107-500 MG-MG-UNIT CHEW Chew by mouth daily.    [provider]  D-Mannose 500 MG CAPS Take 2 capsules by mouth at bedtime.    [provider]  loperamide (ANTI-DIARRHEAL) 2 MG tablet Take 2 mg by mouth 4 (four) times daily as needed for diarrhea or loose stools.    [provider]  metoprolol succinate (TOPROL-XL) 25 MG 24 hr tablet Take 25 mg by mouth at bedtime.  10/22/17 07/09/20  [provider]  Multiple Vitamin (MULTIVITAMIN) capsule Take 2 capsules by mouth daily.     [provider]    Allergies Ciprofloxacin  History reviewed. No pertinent family history.  Social History Social History   Tobacco Use  . Smoking status: Former Games developer  . Smokeless tobacco: Never Used  Vaping Use  . Vaping Use: Never used  Substance Use Topics  . Alcohol use: Not Currently    Alcohol/week: 1.0 - 2.0 standard drink    Types: 1 - 2 Glasses of wine per week    Comment: 1-2 glasses of wine per day  . Drug use: No    Review of Systems  Constitutional: No fever/chills Eyes: No visual changes. ENT: No sore throat.  Cardiovascular: Denies chest pain. Respiratory: Denies shortness of breath. Gastrointestinal: No abdominal pain.  No nausea, no vomiting.  No diarrhea.  No constipation. Genitourinary: Negative for dysuria. Musculoskeletal: Negative for back pain. Skin: Negative for rash. Neurological: Negative for headaches, focal weakness   ____________________________________________   PHYSICAL EXAM:  VITAL SIGNS: ED Triage Vitals  Enc Vitals Group     BP 07/11/20 1235 135/70     Pulse Rate 07/11/20 1235 82     Resp 07/11/20 1235 (!) 22     Temp 07/11/20 1235 98.8 F (37.1 C)     Temp Source 07/11/20 1235 Oral     SpO2 07/11/20 1226 (!) 87 %     Weight 07/11/20  1236 88 lb 2.9 oz (40 kg)     Height 07/11/20 1236 5\' 4"  (1.626 m)     Head Circumference --      Peak Flow --      Pain Score 07/11/20 1236 9     Pain Loc --      Pain Edu? --      Excl. in GC? --     Constitutional: Alert and oriented. Well appearing and in no acute distress. Eyes: Conjunctivae are normal. PER. EOMI. Head: Atraumatic. Nose: No congestion/rhinnorhea. Mouth/Throat: Mucous membranes are moist.  Oropharynx non-erythematous. Neck: No stridor.  No cervical spine tenderness to palpation. Cardiovascular: Normal rate, regular rhythm. Grossly normal heart sounds.  Good peripheral circulation. Respiratory: Normal respiratory effort.  No retractions. Lungs CTAB. Gastrointestinal: Soft and nontender. No distention. No abdominal bruits. No CVA tenderness. Musculoskeletal: No lower extremity tenderness nor edema.  There is some tenderness in the left paraspinous muscle area the low back.  Severity mild.. Neurologic:  Normal speech and language. No gross focal neurologic deficits are appreciated. Skin:  Skin is warm, dry and intact. No rash noted.   ____________________________________________   LABS (all labs ordered are listed, but only abnormal results are displayed)  Labs Reviewed  CBC WITH DIFFERENTIAL/PLATELET - Abnormal; Notable for the following components:      Result Value   Monocytes Absolute 1.7 (*)    All other components within normal limits  COMPREHENSIVE METABOLIC PANEL - Abnormal; Notable for the following components:   Chloride 97 (*)    Glucose, Bld 101 (*)    Total Bilirubin 1.4 (*)    All other components within normal limits  TROPONIN I (HIGH SENSITIVITY) - Abnormal; Notable for the following components:   Troponin I (High Sensitivity) 48 (*)    All other components within normal limits  TROPONIN I (HIGH SENSITIVITY) - Abnormal; Notable for the following components:   Troponin I (High Sensitivity) 50 (*)    All other components within normal limits   CK  LACTIC ACID, PLASMA  LACTIC ACID, PLASMA   ____________________________________________  EKG  EKG read interpreted by me shows normal sinus rhythm rate of 81 normal axis irregular baseline no acute disease ____________________________________________  RADIOLOGY  ED MD interpretation: CT of the head and neck and chest x-ray and knee and hip were all read by radiology and are negative.  I reviewed the films.  Official radiology report(s): DG Chest 2 View  Result Date: 07/11/2020 CLINICAL DATA:  Fall.  Shortness of breath. EXAM: CHEST - 2 VIEW COMPARISON:  Chest x-ray dated July 09, 2020. FINDINGS: Stable cardiomediastinal silhouette. Normal pulmonary vascularity. Chronically coarsened interstitial markings. No focal consolidation, pleural effusion, or pneumothorax. No acute osseous abnormality. Chronic lower thoracic compression deformity. Right VP shunt catheter tubing  again noted. IMPRESSION: No active cardiopulmonary disease. Electronically Signed   By: Obie DredgeWilliam T Derry M.D.   On: 07/11/2020 13:32   CT Head Wo Contrast  Result Date: 07/11/2020 CLINICAL DATA:  Unwitnessed fall.  Headache. EXAM: CT HEAD WITHOUT CONTRAST CT CERVICAL SPINE WITHOUT CONTRAST TECHNIQUE: Multidetector CT imaging of the head and cervical spine was performed following the standard protocol without intravenous contrast. Multiplanar CT image reconstructions of the cervical spine were also generated. COMPARISON:  CT head 02/20/2020 FINDINGS: CT HEAD FINDINGS Brain: Moderate atrophy. Right parietal ventricular shunt catheter courses through the right lateral ventricle with the tip in the anterior corpus callosum unchanged. No hydrocephalus. Patchy white matter hypodensity bilaterally appears chronic and unchanged. Chronic low-density subdural fluid collection left frontal region measures 6 mm in thickness unchanged. No acute hemorrhage or infarction. Vascular: Negative for hyperdense vessel Skull: No acute skull  abnormality. Sinuses/Orbits: Paranasal sinuses clear. Bilateral cataract extraction Other: None CT CERVICAL SPINE FINDINGS Alignment: Slight anterolisthesis C3-4. Skull base and vertebrae: Negative for fracture or mass. Soft tissues and spinal canal: No soft tissue mass or adenopathy. Shunt tubing in the right neck. Atherosclerotic calcification carotid artery bilaterally. Disc levels: Disc degeneration and facet degeneration throughout the cervical spine. Mild spinal stenosis C5-6 and C6-7. Upper chest: Lung apices clear bilaterally. Other: None IMPRESSION: 1. No acute intracranial abnormality 2. Right parietal shunt catheter without hydrocephalus. 3. Chronic low-density subdural fluid collection left frontal region unchanged from CT of 02/20/2020. 4. Cervical spondylosis.  Negative for fracture. Electronically Signed   By: Marlan Palauharles  Clark M.D.   On: 07/11/2020 14:22   CT Cervical Spine Wo Contrast  Result Date: 07/11/2020 CLINICAL DATA:  Unwitnessed fall.  Headache. EXAM: CT HEAD WITHOUT CONTRAST CT CERVICAL SPINE WITHOUT CONTRAST TECHNIQUE: Multidetector CT imaging of the head and cervical spine was performed following the standard protocol without intravenous contrast. Multiplanar CT image reconstructions of the cervical spine were also generated. COMPARISON:  CT head 02/20/2020 FINDINGS: CT HEAD FINDINGS Brain: Moderate atrophy. Right parietal ventricular shunt catheter courses through the right lateral ventricle with the tip in the anterior corpus callosum unchanged. No hydrocephalus. Patchy white matter hypodensity bilaterally appears chronic and unchanged. Chronic low-density subdural fluid collection left frontal region measures 6 mm in thickness unchanged. No acute hemorrhage or infarction. Vascular: Negative for hyperdense vessel Skull: No acute skull abnormality. Sinuses/Orbits: Paranasal sinuses clear. Bilateral cataract extraction Other: None CT CERVICAL SPINE FINDINGS Alignment: Slight  anterolisthesis C3-4. Skull base and vertebrae: Negative for fracture or mass. Soft tissues and spinal canal: No soft tissue mass or adenopathy. Shunt tubing in the right neck. Atherosclerotic calcification carotid artery bilaterally. Disc levels: Disc degeneration and facet degeneration throughout the cervical spine. Mild spinal stenosis C5-6 and C6-7. Upper chest: Lung apices clear bilaterally. Other: None IMPRESSION: 1. No acute intracranial abnormality 2. Right parietal shunt catheter without hydrocephalus. 3. Chronic low-density subdural fluid collection left frontal region unchanged from CT of 02/20/2020. 4. Cervical spondylosis.  Negative for fracture. Electronically Signed   By: Marlan Palauharles  Clark M.D.   On: 07/11/2020 14:22   DG Knee Complete 4 Views Left  Result Date: 07/11/2020 CLINICAL DATA:  Left knee pain after fall. EXAM: LEFT KNEE - COMPLETE 4+ VIEW COMPARISON:  None. FINDINGS: No evidence of fracture, dislocation, or joint effusion. No evidence of arthropathy or other focal bone abnormality. Osteopenia. Faint chondrocalcinosis of the menisci. Vascular calcifications. Soft tissues are unremarkable. IMPRESSION: 1. No acute osseous abnormality. Electronically Signed   By: Vickki HearingWilliam T Derry M.D.  On: 07/11/2020 13:33   DG Hip Unilat With Pelvis 2-3 Views Left  Result Date: 07/11/2020 CLINICAL DATA:  Left hip pain after fall. EXAM: DG HIP (WITH OR WITHOUT PELVIS) 2-3V LEFT COMPARISON:  Left hip x-rays dated July 09, 2020. FINDINGS: There is no evidence of hip fracture or dislocation. Mild degenerative changes of both hip joints. Osteopenia. Soft tissues are unremarkable. IMPRESSION: 1. No acute osseous abnormality. Electronically Signed   By: Obie Dredge M.D.   On: 07/11/2020 13:27    ____________________________________________   PROCEDURES  Procedure(s) performed (including Critical Care):  Procedures   ____________________________________________   INITIAL IMPRESSION /  ASSESSMENT AND PLAN / ED COURSE Patient's EKG today looks very similar to EKG from April.  Patient has still no chest pain shortness of breath or anything outside of what her baseline is.  Her O2 sats are 88 when she is sleeping and 89 when she is awake and talking goes up to 90 a little bit sometimes.  Per her daughter this is normal for her.  They do not want to do anything further just go home and this sounds completely reasonable.  She will follow-up with her regular doctor and return if there are any further symptoms or problems              ____________________________________________   FINAL CLINICAL IMPRESSION(S) / ED DIAGNOSES  Final diagnoses:  Fall, initial encounter     ED Discharge Orders    None      *Please note:  Anne Harrison was evaluated in Emergency Department on 07/11/2020 for the symptoms described in the history of present illness. She was evaluated in the context of the global COVID-19 pandemic, which necessitated consideration that the patient might be at risk for infection with the SARS-CoV-2 virus that causes COVID-19. Institutional protocols and algorithms that pertain to the evaluation of patients at risk for COVID-19 are in a state of rapid change based on information released by regulatory bodies including the CDC and federal and state organizations. These policies and algorithms were followed during the patient's care in the ED.  Some ED evaluations and interventions may be delayed as a result of limited staffing during and the pandemic.*   Note:  This document was prepared using Dragon voice recognition software and may include unintentional dictation errors.    Arnaldo Natal, MD 07/11/20 1810

## 2020-07-11 NOTE — ED Notes (Addendum)
Patient discharged home with daughter, patient received discharge papers. Patient appropriate and cooperative. Vital signs taken. NAD noted.

## 2020-07-11 NOTE — ED Notes (Signed)
Patient titrated down from 4 L of O2 to 3 L of O2 patents daughter Larene Beach says that patient is not on any oxygen at home, and her O2 sats are normally 89-92%, 93% on a good day

## 2020-07-11 NOTE — ED Triage Notes (Signed)
Patient to ER from [facility] via [facility] for c/o unwitnessed fall. Staff were unable to give any time frame for how long patient had been down. Patient c/o L hip and L knee pain (no obvious deformity per EMS). Patient was 87% on RA, placed on 4L O2 (99% after 4L). Other vitals per EMS: 145/68, 98.0 temp, 73 irregular heart beat. No history listed of irregular heart beat in facility's paperwork. Patient alert and oriented x2 (staff unable to state if this is patient's baseline). DNR present with patient.

## 2021-02-15 ENCOUNTER — Emergency Department: Payer: Medicare Other

## 2021-02-15 ENCOUNTER — Encounter: Payer: Self-pay | Admitting: Emergency Medicine

## 2021-02-15 ENCOUNTER — Other Ambulatory Visit: Payer: Self-pay

## 2021-02-15 ENCOUNTER — Emergency Department
Admission: EM | Admit: 2021-02-15 | Discharge: 2021-02-16 | Disposition: A | Discharge status: Home / Self Care | Payer: Medicare Other | Attending: Emergency Medicine | Admitting: Emergency Medicine

## 2021-02-15 DIAGNOSIS — Z79899 Other long term (current) drug therapy: Secondary | ICD-10-CM | POA: Insufficient documentation

## 2021-02-15 DIAGNOSIS — S0083XA Contusion of other part of head, initial encounter: Secondary | ICD-10-CM | POA: Diagnosis not present

## 2021-02-15 DIAGNOSIS — Z87891 Personal history of nicotine dependence: Secondary | ICD-10-CM | POA: Insufficient documentation

## 2021-02-15 DIAGNOSIS — N3000 Acute cystitis without hematuria: Secondary | ICD-10-CM | POA: Diagnosis not present

## 2021-02-15 DIAGNOSIS — I10 Essential (primary) hypertension: Secondary | ICD-10-CM | POA: Diagnosis not present

## 2021-02-15 DIAGNOSIS — Y92129 Unspecified place in nursing home as the place of occurrence of the external cause: Secondary | ICD-10-CM | POA: Insufficient documentation

## 2021-02-15 DIAGNOSIS — W19XXXA Unspecified fall, initial encounter: Secondary | ICD-10-CM | POA: Insufficient documentation

## 2021-02-15 LAB — CBC
HCT: 38.8 % (ref 36.0–46.0)
Hemoglobin: 12.9 g/dL (ref 12.0–15.0)
MCH: 30.9 pg (ref 26.0–34.0)
MCHC: 33.2 g/dL (ref 30.0–36.0)
MCV: 92.8 fL (ref 80.0–100.0)
Platelets: 147 10*3/uL — ABNORMAL LOW (ref 150–400)
RBC: 4.18 MIL/uL (ref 3.87–5.11)
RDW: 14 % (ref 11.5–15.5)
WBC: 7.2 10*3/uL (ref 4.0–10.5)
nRBC: 0 % (ref 0.0–0.2)

## 2021-02-15 LAB — BASIC METABOLIC PANEL
Anion gap: 10 (ref 5–15)
BUN: 16 mg/dL (ref 8–23)
CO2: 24 mmol/L (ref 22–32)
Calcium: 9.4 mg/dL (ref 8.9–10.3)
Chloride: 100 mmol/L (ref 98–111)
Creatinine, Ser: 0.81 mg/dL (ref 0.44–1.00)
GFR, Estimated: >60 mL/min (ref >60)
Glucose, Bld: 103 mg/dL — ABNORMAL HIGH (ref 70–99)
Potassium: 4.5 mmol/L (ref 3.5–5.1)
Sodium: 134 mmol/L — ABNORMAL LOW (ref 135–145)

## 2021-02-15 LAB — URINALYSIS, COMPLETE (UACMP) WITH MICROSCOPIC
Bilirubin Urine: NEGATIVE
Glucose, UA: NEGATIVE mg/dL
Hgb urine dipstick: NEGATIVE
Ketones, ur: 5 mg/dL — AB
Nitrite: NEGATIVE
Protein, ur: NEGATIVE mg/dL
Specific Gravity, Urine: 1.021 (ref 1.005–1.030)
pH: 6 (ref 5.0–8.0)

## 2021-02-15 NOTE — ED Triage Notes (Signed)
Pt arrived via [facility] from [facility] with reports of unwitnessed fall, daughter present at this time states staff member went to patient's room and found her on the floor, pt states she does not remember falling.  Pt arrived with abrasion and hematoma to L side of the head.

## 2021-02-15 NOTE — ED Triage Notes (Signed)
EMS brings pt in from [facility] for unwitnessed fall; abrasion and hematoma to left side forehead

## 2021-02-16 MED ORDER — CEFDINIR 300 MG PO CAPS: 300.0000 mg | ORAL_CAPSULE | Freq: Two times a day (BID) | ORAL | 0 refills | Status: AC

## 2021-02-16 MED ORDER — CEFDINIR 300 MG PO CAPS
300.0000 mg | ORAL_CAPSULE | Freq: Once | ORAL | Status: AC
  Administered 2021-02-16: 300 mg via ORAL
  Filled 2021-02-16: qty 1

## 2021-02-17 NOTE — ED Provider Notes (Signed)
[facility] Emergency Department Provider Note   ____________________________________________   Event Date/Time   First MD Initiated Contact with Patient 02/16/21 0031     (approximate)  I have reviewed the triage vital signs and the nursing notes.   HISTORY  Chief Complaint Fall (Unwitnessed )    HPI Anne Harrison is a 87 y.o. female with below stated past medical history the presents via EMS from [facility] assisted living facility with reports of an unwitnessed fall.  Patient has an abrasion and hematoma to the left side of her forehead but denies any other complaints at this time.  Patient was allegedly found in her room on the floor and did not remember falling.  Further history and review of systems unable to assess secondary to patient's mental status which is at baseline according to EMS         Past Medical History:  Diagnosis Date  . Bronchitis, not specified as acute or chronic   . Essential (primary) hypertension   . Other emphysema (HCC)   . Tinea unguium   . Urge incontinence     Patient Active Problem List   Diagnosis Date Noted  . Onychomycosis 10/22/2017  . Essential hypertension 10/07/2017    History reviewed. No pertinent surgical history.  Prior to Admission medications   Medication Sig Start Date End Date Taking? Authorizing Provider  cefdinir (OMNICEF) 300 MG capsule Take 1 capsule (300 mg total) by mouth 2 (two) times daily for 5 days. 02/16/21 02/21/21 Yes Merwyn Katos, MD  acetaminophen (TYLENOL) 325 MG tablet Take 325 mg by mouth every 4 (four) hours as needed for fever.    [provider]  amLODipine (NORVASC) 5 MG tablet Take 5 mg by mouth daily.  10/22/17 07/09/20  [provider]  Calcium-Phosphorus-Vitamin D (CITRACAL +D3) 250-107-500 MG-MG-UNIT CHEW Chew by mouth daily.    [provider]  D-Mannose 500 MG CAPS Take 2 capsules by mouth at bedtime.    [provider]   loperamide (ANTI-DIARRHEAL) 2 MG tablet Take 2 mg by mouth 4 (four) times daily as needed for diarrhea or loose stools.    [provider]  metoprolol succinate (TOPROL-XL) 25 MG 24 hr tablet Take 25 mg by mouth at bedtime.  10/22/17 07/09/20  [provider]  Multiple Vitamin (MULTIVITAMIN) capsule Take 2 capsules by mouth daily.     [provider]    Allergies Ciprofloxacin  History reviewed. No pertinent family history.  Social History Social History   Tobacco Use  . Smoking status: Former Games developer  . Smokeless tobacco: Never Used  Vaping Use  . Vaping Use: Never used  Substance Use Topics  . Alcohol use: Not Currently    Alcohol/week: 1.0 - 2.0 standard drink    Types: 1 - 2 Glasses of wine per week    Comment: 1-2 glasses of wine per day  . Drug use: No    Review of Systems Constitutional: No fever/chills Eyes: No visual changes. ENT: No sore throat. Cardiovascular: Denies chest pain. Respiratory: Denies shortness of breath. Gastrointestinal: No abdominal pain.  No nausea, no vomiting.  No diarrhea. Genitourinary: Negative for dysuria. Musculoskeletal: Negative for acute arthralgias Skin: Negative for rash.  Abrasion to the left anterior forehead Neurological: Positive for headache, weakness/numbness/paresthesias in any extremity Psychiatric: Negative for suicidal ideation/homicidal ideation   ____________________________________________   PHYSICAL EXAM:  VITAL SIGNS: ED Triage Vitals  Enc Vitals Group     BP 02/15/21 2149 Marland Kitchen)  173/80     Pulse Rate 02/15/21 2149 64     Resp 02/15/21 2149 18     Temp 02/15/21 2149 98.3 F (36.8 C)     Temp Source 02/15/21 2149 Oral     SpO2 02/15/21 2130 96 %     Weight 02/15/21 2150 111 lb (50.3 kg)     Height 02/15/21 2150 5' (1.524 m)     Head Circumference --      Peak Flow --      Pain Score 02/15/21 2149 0     Pain Loc --      Pain Edu? --      Excl. in GC? --    Constitutional:  Alert and oriented. Well appearing and in no acute distress. Eyes: Conjunctivae are normal. PERRL. Head: Left forehead abrasion Nose: No congestion/rhinnorhea. Mouth/Throat: Mucous membranes are moist. Neck: No stridor Cardiovascular: Grossly normal heart sounds.  Good peripheral circulation. Respiratory: Normal respiratory effort.  No retractions. Gastrointestinal: Soft and nontender. No distention. Musculoskeletal: No obvious deformities Neurologic:  Normal speech and language. No gross focal neurologic deficits are appreciated. Skin:  Skin is warm and dry. No rash noted.  3 cm x 1 cm abrasion over left eyebrow that is hemostatic and superficial Psychiatric: Mood and affect are normal. Speech and behavior are normal.  ____________________________________________   LABS (all labs ordered are listed, but only abnormal results are displayed)  Labs Reviewed  BASIC METABOLIC PANEL - Abnormal; Notable for the following components:      Result Value   Sodium 134 (*)    Glucose, Bld 103 (*)    All other components within normal limits  CBC - Abnormal; Notable for the following components:   Platelets 147 (*)    All other components within normal limits  URINALYSIS, COMPLETE (UACMP) WITH MICROSCOPIC - Abnormal; Notable for the following components:   Color, Urine YELLOW (*)    APPearance HAZY (*)    Ketones, ur 5 (*)    Leukocytes,Ua MODERATE (*)    Bacteria, UA RARE (*)    All other components within normal limits   ____________________________________________  EKG  ED ECG REPORT I, Merwyn Katos, the attending physician, personally viewed and interpreted this ECG.  Date: 02/17/2021 EKG Time: 2152 Rate: 68 Rhythm: normal sinus rhythm QRS Axis: normal Intervals: normal ST/T Wave abnormalities: normal Narrative Interpretation: no evidence of acute ischemia  ____________________________________________  RADIOLOGY  ED MD interpretation: CT of the head and cervical spine  without contrast shows no evidence of acute abnormalities  Official radiology report(s): No results found.  ____________________________________________   PROCEDURES  Procedure(s) performed (including Critical Care):  .1-3 Lead EKG Interpretation Performed by: Merwyn Katos, MD Authorized by: Merwyn Katos, MD     Interpretation: normal     ECG rate:  61   ECG rate assessment: normal     Rhythm: sinus rhythm     Ectopy: none     Conduction: normal       ____________________________________________   INITIAL IMPRESSION / ASSESSMENT AND PLAN / ED COURSE  As part of my medical decision making, I reviewed the following data within the electronic MEDICAL RECORD NUMBER Nursing notes reviewed and incorporated, Labs reviewed, EKG interpreted, Old chart reviewed, Radiograph reviewed and Notes from prior ED visits reviewed and incorporated     Patient presenting with head trauma.  Patient's neurological exam was non-focal and unremarkable.  [place] Head CT Rule was applied and patient did not fall into  the low risk category so a head CT was obtained.  This showed no significant findings.  At this time, it is felt that the most likely explanation for the patient's symptoms is concussion.   I also considered SAH, SDH, Epidural Hematoma, IPH, skull fracture, migraine but this appears less likely considering the data gathered thus far.   Patient remained stable and neurologically intact while in the emergency department.  Discussed warning signs that would prompt return to ED.  Head trauma handout was provided.  Discussed in detail concussion management.  No sports or strenuous activity until symptoms free.  Return to emergency department urgently if new or worsening symptoms develop.    Impression:  Concussion Left forehead abrasion UTI  Plan  Discharge from ED with prescription for cefdinir Tylenol for pain control. Avoid aspirin, NSAIDs, or other blood thinners. Advised patient on  supportive measures for cognitive rest - avoid use of cognitive function for at least 24 hours.  This means no tv, books, texting, computers, etc. Limit visitors to the house.  Head trauma instructions provided in discharge instructions Instructed Pt to monitor for neurologic symptoms, severe HA, change in mental status, seizures, loss of conciousness. Instructed Pt to f/up w/ PCP in 3 days or ETC should symptoms worsen or not improve. Pt verbally expressed understanding and all questions were addressed to Pt's satisfaction.      ____________________________________________   FINAL CLINICAL IMPRESSION(S) / ED DIAGNOSES  Final diagnoses:  Fall, initial encounter  Contusion of face, initial encounter  Acute cystitis without hematuria     ED Discharge Orders         Ordered    cefdinir (OMNICEF) 300 MG capsule  2 times daily        02/16/21 0247           Note:  This document was prepared using Dragon voice recognition software and may include unintentional dictation errors.   Merwyn Katos, MD 02/17/21 [phone]

## 2021-04-02 ENCOUNTER — Other Ambulatory Visit: Payer: Self-pay

## 2021-04-02 ENCOUNTER — Emergency Department: Payer: Medicare Other

## 2021-04-02 ENCOUNTER — Other Ambulatory Visit (INDEPENDENT_AMBULATORY_CARE_PROVIDER_SITE_OTHER): Payer: Self-pay | Admitting: Vascular Surgery

## 2021-04-02 ENCOUNTER — Inpatient Hospital Stay
Admission: EM | Admit: 2021-04-02 | Discharge: 2021-04-05 | DRG: 270 | Disposition: A | Discharge status: Skilled Nursing Facility | Payer: Medicare Other | Source: Skilled Nursing Facility | Attending: Family Medicine | Admitting: Family Medicine

## 2021-04-02 DIAGNOSIS — O2230 Deep phlebothrombosis in pregnancy, unspecified trimester: Secondary | ICD-10-CM

## 2021-04-02 DIAGNOSIS — I2699 Other pulmonary embolism without acute cor pulmonale: Secondary | ICD-10-CM | POA: Diagnosis not present

## 2021-04-02 DIAGNOSIS — Z66 Do not resuscitate: Secondary | ICD-10-CM | POA: Diagnosis present

## 2021-04-02 DIAGNOSIS — Z23 Encounter for immunization: Secondary | ICD-10-CM

## 2021-04-02 DIAGNOSIS — J438 Other emphysema: Secondary | ICD-10-CM | POA: Diagnosis not present

## 2021-04-02 DIAGNOSIS — I82409 Acute embolism and thrombosis of unspecified deep veins of unspecified lower extremity: Secondary | ICD-10-CM | POA: Diagnosis present

## 2021-04-02 DIAGNOSIS — D638 Anemia in other chronic diseases classified elsewhere: Secondary | ICD-10-CM | POA: Diagnosis present

## 2021-04-02 DIAGNOSIS — I2782 Chronic pulmonary embolism: Secondary | ICD-10-CM | POA: Diagnosis not present

## 2021-04-02 DIAGNOSIS — I824Z1 Acute embolism and thrombosis of unspecified deep veins of right distal lower extremity: Secondary | ICD-10-CM | POA: Diagnosis not present

## 2021-04-02 DIAGNOSIS — Z8616 Personal history of COVID-19: Secondary | ICD-10-CM

## 2021-04-02 DIAGNOSIS — I82431 Acute embolism and thrombosis of right popliteal vein: Secondary | ICD-10-CM | POA: Diagnosis present

## 2021-04-02 DIAGNOSIS — F0390 Unspecified dementia without behavioral disturbance: Secondary | ICD-10-CM | POA: Diagnosis present

## 2021-04-02 DIAGNOSIS — Z8249 Family history of ischemic heart disease and other diseases of the circulatory system: Secondary | ICD-10-CM

## 2021-04-02 DIAGNOSIS — Z9049 Acquired absence of other specified parts of digestive tract: Secondary | ICD-10-CM

## 2021-04-02 DIAGNOSIS — I10 Essential (primary) hypertension: Secondary | ICD-10-CM | POA: Diagnosis not present

## 2021-04-02 DIAGNOSIS — Z9181 History of falling: Secondary | ICD-10-CM

## 2021-04-02 DIAGNOSIS — Z79899 Other long term (current) drug therapy: Secondary | ICD-10-CM

## 2021-04-02 DIAGNOSIS — I82411 Acute embolism and thrombosis of right femoral vein: Secondary | ICD-10-CM | POA: Diagnosis not present

## 2021-04-02 DIAGNOSIS — Z87891 Personal history of nicotine dependence: Secondary | ICD-10-CM

## 2021-04-02 DIAGNOSIS — U071 COVID-19: Secondary | ICD-10-CM | POA: Diagnosis present

## 2021-04-02 DIAGNOSIS — J449 Chronic obstructive pulmonary disease, unspecified: Secondary | ICD-10-CM | POA: Diagnosis present

## 2021-04-02 DIAGNOSIS — I82461 Acute embolism and thrombosis of right calf muscular vein: Secondary | ICD-10-CM | POA: Diagnosis present

## 2021-04-02 DIAGNOSIS — I2693 Single subsegmental pulmonary embolism without acute cor pulmonale: Secondary | ICD-10-CM | POA: Diagnosis present

## 2021-04-02 DIAGNOSIS — Z90710 Acquired absence of both cervix and uterus: Secondary | ICD-10-CM

## 2021-04-02 DIAGNOSIS — I82441 Acute embolism and thrombosis of right tibial vein: Secondary | ICD-10-CM | POA: Diagnosis present

## 2021-04-02 LAB — CBC WITH DIFFERENTIAL/PLATELET
Abs Immature Granulocytes: 0.04 10*3/uL (ref 0.00–0.07)
Basophils Absolute: 0.1 10*3/uL (ref 0.0–0.1)
Basophils Relative: 1 %
Eosinophils Absolute: 0.1 10*3/uL (ref 0.0–0.5)
Eosinophils Relative: 1 %
HCT: 35.3 % — ABNORMAL LOW (ref 36.0–46.0)
Hemoglobin: 11.6 g/dL — ABNORMAL LOW (ref 12.0–15.0)
Immature Granulocytes: 0 %
Lymphocytes Relative: 15 %
Lymphs Abs: 1.4 10*3/uL (ref 0.7–4.0)
MCH: 30.8 pg (ref 26.0–34.0)
MCHC: 32.9 g/dL (ref 30.0–36.0)
MCV: 93.6 fL (ref 80.0–100.0)
Monocytes Absolute: 2 10*3/uL — ABNORMAL HIGH (ref 0.1–1.0)
Monocytes Relative: 21 %
Neutro Abs: 5.7 10*3/uL (ref 1.7–7.7)
Neutrophils Relative %: 62 %
Platelets: 187 10*3/uL (ref 150–400)
RBC: 3.77 MIL/uL — ABNORMAL LOW (ref 3.87–5.11)
RDW: 14.6 % (ref 11.5–15.5)
WBC: 9.3 10*3/uL (ref 4.0–10.5)
nRBC: 0 % (ref 0.0–0.2)

## 2021-04-02 LAB — RESP PANEL BY RT-PCR (FLU A&B, COVID) ARPGX2
Influenza A by PCR: NEGATIVE
Influenza B by PCR: NEGATIVE
SARS Coronavirus 2 by RT PCR: POSITIVE — AB

## 2021-04-02 LAB — BASIC METABOLIC PANEL
Anion gap: 11 (ref 5–15)
BUN: 15 mg/dL (ref 8–23)
CO2: 26 mmol/L (ref 22–32)
Calcium: 9.1 mg/dL (ref 8.9–10.3)
Chloride: 98 mmol/L (ref 98–111)
Creatinine, Ser: 0.73 mg/dL (ref 0.44–1.00)
GFR, Estimated: >60 mL/min (ref >60)
Glucose, Bld: 103 mg/dL — ABNORMAL HIGH (ref 70–99)
Potassium: 4 mmol/L (ref 3.5–5.1)
Sodium: 135 mmol/L (ref 135–145)

## 2021-04-02 LAB — PROTIME-INR
INR: 1.1 (ref 0.8–1.2)
Prothrombin Time: 14.3 seconds (ref 11.4–15.2)

## 2021-04-02 LAB — APTT: aPTT: 53 seconds — ABNORMAL HIGH (ref 24–36)

## 2021-04-02 LAB — MRSA PCR SCREENING: MRSA by PCR: NEGATIVE

## 2021-04-02 LAB — HEPARIN LEVEL (UNFRACTIONATED): Heparin Unfractionated: 0.16 IU/mL — ABNORMAL LOW (ref 0.30–0.70)

## 2021-04-02 MED ORDER — CALCIUM CARBONATE-VITAMIN D 500-200 MG-UNIT PO TABS
1.0000 | ORAL_TABLET | Freq: Every day | ORAL | Status: DC
  Administered 2021-04-04 – 2021-04-05 (×2): 1 via ORAL
  Filled 2021-04-02 (×2): qty 1

## 2021-04-02 MED ORDER — DM-GUAIFENESIN ER 30-600 MG PO TB12: 1.0000 | ORAL_TABLET | Freq: Two times a day (BID) | ORAL | Status: DC | PRN

## 2021-04-02 MED ORDER — ONDANSETRON HCL 4 MG/2ML IJ SOLN: 4.0000 mg | Freq: Three times a day (TID) | INTRAMUSCULAR | Status: DC | PRN

## 2021-04-02 MED ORDER — BENZOCAINE 10 % MT GEL
1.0000 "application " | OROMUCOSAL | Status: DC | PRN
  Filled 2021-04-02: qty 9

## 2021-04-02 MED ORDER — ALBUTEROL SULFATE (2.5 MG/3ML) 0.083% IN NEBU: 2.5000 mg | INHALATION_SOLUTION | RESPIRATORY_TRACT | Status: DC | PRN

## 2021-04-02 MED ORDER — HEPARIN BOLUS VIA INFUSION
1500.0000 [IU] | Freq: Once | INTRAVENOUS | Status: AC
  Administered 2021-04-02: 21:00:00 1500 [IU] via INTRAVENOUS
  Filled 2021-04-02: qty 1500

## 2021-04-02 MED ORDER — HEPARIN (PORCINE) 25000 UT/250ML-% IV SOLN
1100.0000 [IU]/h | INTRAVENOUS | Status: DC
  Administered 2021-04-02: 800 [IU]/h via INTRAVENOUS
  Filled 2021-04-02 (×2): qty 250

## 2021-04-02 MED ORDER — METOPROLOL SUCCINATE ER 25 MG PO TB24
25.0000 mg | ORAL_TABLET | Freq: Every day | ORAL | Status: DC
  Administered 2021-04-02 – 2021-04-04 (×3): 25 mg via ORAL
  Filled 2021-04-02 (×3): qty 1

## 2021-04-02 MED ORDER — SODIUM CHLORIDE 0.9 % IV SOLN: INTRAVENOUS | Status: DC

## 2021-04-02 MED ORDER — HYDRALAZINE HCL 20 MG/ML IJ SOLN: 5.0000 mg | INTRAMUSCULAR | Status: DC | PRN

## 2021-04-02 MED ORDER — AMLODIPINE BESYLATE 5 MG PO TABS
5.0000 mg | ORAL_TABLET | Freq: Every day | ORAL | Status: DC
  Administered 2021-04-02 – 2021-04-05 (×4): 5 mg via ORAL
  Filled 2021-04-02 (×4): qty 1

## 2021-04-02 MED ORDER — IOHEXOL 350 MG/ML SOLN
75.0000 mL | Freq: Once | INTRAVENOUS | Status: AC | PRN
  Administered 2021-04-02: 75 mL via INTRAVENOUS

## 2021-04-02 MED ORDER — ALBUTEROL SULFATE HFA 108 (90 BASE) MCG/ACT IN AERS: 2.0000 | INHALATION_SPRAY | RESPIRATORY_TRACT | Status: DC | PRN

## 2021-04-02 MED ORDER — PNEUMOCOCCAL VAC POLYVALENT 25 MCG/0.5ML IJ INJ
0.5000 mL | INJECTION | INTRAMUSCULAR | Status: AC
  Administered 2021-04-04: 15:00:00 0.5 mL via INTRAMUSCULAR
  Filled 2021-04-02: qty 0.5

## 2021-04-02 MED ORDER — HEPARIN BOLUS VIA INFUSION
4200.0000 [IU] | Freq: Once | INTRAVENOUS | Status: AC
  Administered 2021-04-02: 12:00:00 4200 [IU] via INTRAVENOUS
  Filled 2021-04-02: qty 4200

## 2021-04-02 MED ORDER — ASCORBIC ACID 500 MG PO TABS
1000.0000 mg | ORAL_TABLET | Freq: Every day | ORAL | Status: DC
  Administered 2021-04-02 – 2021-04-05 (×3): 1000 mg via ORAL
  Filled 2021-04-02 (×3): qty 2

## 2021-04-02 MED ORDER — ACETAMINOPHEN 650 MG RE SUPP: 650.0000 mg | Freq: Four times a day (QID) | RECTAL | Status: DC | PRN

## 2021-04-02 NOTE — H&P (Signed)
History and Physical    Tinie Mcgloin ZOX:[id number] DOB: 06-23-1924 DOA: 04/02/2021  Referring MD/NP/PA:   PCP: [facility]   Patient coming from:  The patient is coming from SNF        Chief Complaint: right leg swelling  HPI: Charliegh Vasudevan is a 87 y.o. female with medical history significant of hypertension, emphysema, dementia, uterine incontinence, who presents with right leg swelling.  Per her daughter at the bedside, pt was noticed to have right leg swelling and redness by staff in facility this morning.  Patient does not have fever or chills.  Patient has mild pain in right leg.  She reports mild dry cough, mild intermittent shortness breath recently, which has not changed.  Denies any chest pain.  No nausea, vomiting, diarrhea or abdominal pain.  No symptoms of UTI.  Per her daughter, patient fell in April 2022, and had negative CT scan of head.  After that, patient did not have new fall or injury.  No rectal bleeding or dark stool.  Patient has dementia, mental status is at her baseline per her daughter.  Daughter reported that the patient had a positive COVID-19 rapid test on April/12/22, 10 days later patient had negative COVID PCR.  ED Course: pt was found to have WBC 9.3, INR 1.1, postiver COVID-19 PCR, electrolytes renal function okay, temperature normal, blood pressure 162/70, heart rate 84, RR 18, oxygen saturation 97% on room air.  X-ray of left ankle is negative for bony fracture.  Lower extremity Doppler showed extensive right leg DVT.  CTA of chest showed small subsegmental right lower lobe PE.  Patient is placed on MedSurg bed of observation.  Dr. Gilda Crease of [facility] is consulted.   Review of Systems:   General: no fevers, chills, no body weight gain, has fatigue HEENT: no blurry vision, hearing changes or sore throat Respiratory: has mild dyspnea, coughing, no wheezing CV: no chest pain, no palpitations GI: no nausea, vomiting, abdominal pain,  diarrhea, constipation GU: no dysuria, burning on urination, increased urinary frequency, hematuria  Ext: has right leg swelling and pain Neuro: no unilateral weakness, numbness, or tingling, no vision change or hearing loss Skin: no rash, no skin tear. MSK: No muscle spasm, no deformity, no limitation of range of movement in spin Heme: No easy bruising.  Travel history: No recent long distant travel.  Allergy:  Allergies  Allergen Reactions  . Ciprofloxacin Hives    Past Medical History:  Diagnosis Date  . Bronchitis, not specified as acute or chronic   . Essential (primary) hypertension   . Other emphysema (HCC)   . Tinea unguium   . Urge incontinence     Past Surgical History:  Procedure Laterality Date  . ABDOMINAL HYSTERECTOMY    . CHOLECYSTECTOMY      Social History:  reports that she has quit smoking. She has never used smokeless tobacco. She reports previous alcohol use of about 1.0 - 2.0 standard drink of alcohol per week. She reports that she does not use drugs.  Family History:  Family History  Problem Relation Age of Onset  . Hypertension Mother   . Heart attack Father      Prior to Admission medications   Medication Sig Start Date End Date Taking? Authorizing Provider  acetaminophen (TYLENOL) 325 MG tablet Take 650 mg by mouth 3 (three) times daily.   Yes [provider]  acetaminophen (TYLENOL) 325 MG tablet Take 325 mg by mouth every 4 (four) hours as needed  for mild pain.   Yes [provider]  albuterol (VENTOLIN HFA) 108 (90 Base) MCG/ACT inhaler Inhale 1 puff into the lungs 2 (two) times daily.   Yes [provider]  amLODipine (NORVASC) 5 MG tablet Take 5 mg by mouth daily.    Yes [provider]  ascorbic acid (VITAMIN C) 500 MG tablet Take 1,000 mg by mouth daily.   Yes [provider]  benzocaine (ORAJEL) 10 % mucosal gel Use as directed 1 application in the mouth or throat every 4 (four) hours as needed  for mouth pain.   Yes [provider]  Calcium Carb-Cholecalciferol (OYSTER SHELL CALCIUM) 500-400 MG-UNIT TABS Take 1 tablet by mouth daily.   Yes [provider]  metoprolol succinate (TOPROL-XL) 25 MG 24 hr tablet Take 25 mg by mouth at bedtime.    Yes [provider]    Physical Exam: Vitals:   04/02/21 0811 04/02/21 0812 04/02/21 1123  BP: (!) 162/70  (!) 129/46  Pulse: 84  75  Resp:  18 14  Temp: 97.8 F (36.6 C)  (!) 97.5 F (36.4 C)  TempSrc: Axillary  Oral  SpO2: 97%  96%  Weight: 51.9 kg    Height: 4\' 11"  (1.499 m)     General: Not in acute distress HEENT:       Eyes: PERRL, EOMI, no scleral icterus.       ENT: No discharge from the ears and nose, no pharynx injection, no tonsillar enlargement.        Neck: No JVD, no bruit, no mass felt. Heme: No neck lymph node enlargement. Cardiac: S1/S2, RRR, No murmurs, No gallops or rubs. Respiratory: No rales, wheezing, rhonchi or rubs. GI: Soft, nondistended, nontender, no rebound pain, no organomegaly, BS present. GU: No hematuria Ext: 1+DP/PT pulse bilaterally. Has erythema, swelling, tenderness in the right leg Musculoskeletal: No joint deformities, No joint redness or warmth, no limitation of ROM in spin. Skin: No rashes.  Neuro: Alert, cranial nerves II-XII grossly intact, moves all extremities normally Psych: Patient is not psychotic, no suicidal or hemocidal ideation.  Labs on Admission: I have personally reviewed following labs and imaging studies  CBC: Recent Labs  Lab 04/02/21 0814  WBC 9.3  NEUTROABS 5.7  HGB 11.6*  HCT 35.3*  MCV 93.6  PLT 187   Basic Metabolic Panel: Recent Labs  Lab 04/02/21 0814  NA 135  K 4.0  CL 98  CO2 26  GLUCOSE 103*  BUN 15  CREATININE 0.73  CALCIUM 9.1   GFR: Estimated Creatinine Clearance: 31.7 mL/min (by C-G formula based on SCr of 0.73 mg/dL). Liver Function Tests: No results for input(s): AST, ALT, ALKPHOS, BILITOT, PROT, ALBUMIN in  the last 168 hours. No results for input(s): LIPASE, AMYLASE in the last 168 hours. No results for input(s): AMMONIA in the last 168 hours. Coagulation Profile: Recent Labs  Lab 04/02/21 0814  INR 1.1   Cardiac Enzymes: No results for input(s): CKTOTAL, CKMB, CKMBINDEX, TROPONINI in the last 168 hours. BNP (last 3 results) No results for input(s): PROBNP in the last 8760 hours. HbA1C: No results for input(s): HGBA1C in the last 72 hours. CBG: No results for input(s): GLUCAP in the last 168 hours. Lipid Profile: No results for input(s): CHOL, HDL, LDLCALC, TRIG, CHOLHDL, LDLDIRECT in the last 72 hours. Thyroid Function Tests: No results for input(s): TSH, T4TOTAL, FREET4, T3FREE, THYROIDAB in the last 72 hours. Anemia Panel: No results for input(s): VITAMINB12, FOLATE, FERRITIN, TIBC, IRON,  RETICCTPCT in the last 72 hours. Urine analysis:    Component Value Date/Time   COLORURINE YELLOW (A) 02/15/2021 2212   APPEARANCEUR HAZY (A) 02/15/2021 2212   LABSPEC 1.021 02/15/2021 2212   PHURINE 6.0 02/15/2021 2212   GLUCOSEU NEGATIVE 02/15/2021 2212   HGBUR NEGATIVE 02/15/2021 2212   BILIRUBINUR NEGATIVE 02/15/2021 2212   KETONESUR 5 (A) 02/15/2021 2212   PROTEINUR NEGATIVE 02/15/2021 2212   NITRITE NEGATIVE 02/15/2021 2212   LEUKOCYTESUR MODERATE (A) 02/15/2021 2212   Sepsis Labs: @LABRCNTIP (procalcitonin:4,lacticidven:4) ) Recent Results (from the past 240 hour(s))  Resp Panel by RT-PCR (Flu A&B, Covid) Nasopharyngeal Swab     Status: Abnormal   Collection Time: 04/02/21 10:43 AM   Specimen: Nasopharyngeal Swab; Nasopharyngeal(NP) swabs in vial transport medium  Result Value Ref Range Status   SARS Coronavirus 2 by RT PCR POSITIVE (A) NEGATIVE Final    Comment: RESULT CALLED TO, READ BACK BY AND VERIFIED WITH: TIFFANIE ROBINSON 1158 04/02/2021 DLB (NOTE) SARS-CoV-2 target nucleic acids are DETECTED.  The SARS-CoV-2 RNA is generally detectable in upper respiratory specimens  during the acute phase of infection. Positive results are indicative of the presence of the identified virus, but do not rule out bacterial infection or co-infection with other pathogens not detected by the test. Clinical correlation with patient history and other diagnostic information is necessary to determine patient infection status. The expected result is Negative.  Fact Sheet for Patients: BloggerCourse.comhttps://www.fda.gov/media/152166/download  Fact Sheet for Healthcare Providers: SeriousBroker.ithttps://www.fda.gov/media/152162/download  This test is not yet approved or cleared by the [place] FDA and  has been authorized for detection and/or diagnosis of SARS-CoV-2 by FDA under an Emergency Use Authorization (EUA).  This EUA will remain in effect (meaning this test can  be used) for the duration of  the COVID-19 declaration under Section 564(b)(1) of the Act, 21 U.S.C. section 360bbb-3(b)(1), unless the authorization is terminated or revoked sooner.     Influenza A by PCR NEGATIVE NEGATIVE Final   Influenza B by PCR NEGATIVE NEGATIVE Final    Comment: (NOTE) The Xpert Xpress SARS-CoV-2/FLU/RSV plus assay is intended as an aid in the diagnosis of influenza from Nasopharyngeal swab specimens and should not be used as a sole basis for treatment. Nasal washings and aspirates are unacceptable for Xpert Xpress SARS-CoV-2/FLU/RSV testing.  Fact Sheet for Patients: BloggerCourse.comhttps://www.fda.gov/media/152166/download  Fact Sheet for Healthcare Providers: SeriousBroker.ithttps://www.fda.gov/media/152162/download  This test is not yet approved or cleared by the [place] FDA and has been authorized for detection and/or diagnosis of SARS-CoV-2 by FDA under an Emergency Use Authorization (EUA). This EUA will remain in effect (meaning this test can be used) for the duration of the COVID-19 declaration under Section 564(b)(1) of the Act, 21 U.S.C. section 360bbb-3(b)(1), unless the authorization is terminated  or revoked.  Performed at [facility], [address] [place], [place] [address]      Radiological Exams on Admission: DG Ankle Complete Right  Result Date: 04/02/2021 CLINICAL DATA:  Soft tissue swelling and redness EXAM: RIGHT ANKLE - COMPLETE 3+ VIEW COMPARISON:  None. FINDINGS: Frontal, oblique, and lateral views were obtained. There is soft tissue swelling. No evident fracture or joint effusion. No joint space narrowing or erosion. There is a small posterior calcaneal spur. Ankle mortise appears intact. No bony destruction. IMPRESSION: Soft tissue swelling. No fracture or appreciable arthropathy. Bony structures intact. Small posterior calcaneal spur. Electronically Signed   By: Bretta BangWilliam  Woodruff III M.D.   On: 04/02/2021 08:51   CT Angio Chest PE  W and/or Wo Contrast  Result Date: 04/02/2021 CLINICAL DATA:  RIGHT leg redness and swelling, dementia, high clinical suspicion of pulmonary embolism EXAM: CT ANGIOGRAPHY CHEST WITH CONTRAST TECHNIQUE: Multidetector CT imaging of the chest was performed using the standard protocol during bolus administration of intravenous contrast. Multiplanar CT image reconstructions and MIPs were obtained to evaluate the vascular anatomy. CONTRAST:  6mL OMNIPAQUE IOHEXOL 350 MG/ML SOLN IV COMPARISON:  None FINDINGS: Cardiovascular: Atherosclerotic calcifications aorta, coronary arteries and proximal great vessels. Aorta normal caliber without aneurysm or dissection. Thrombus identified within descending thoracic aorta. Pulmonary arteries well opacified. Single small pulmonary embolus identified within a subsegmental RIGHT lower lobe pulmonary artery. Remaining pulmonary arteries patent. Central pulmonary arteries are borderline dilated. Minimal pericardial fluid. Cardiac chambers unremarkable. Mediastinum/Nodes: Large hiatal hernia. Esophagus unremarkable. Few normal size mediastinal lymph nodes. No thoracic adenopathy. Lungs/Pleura: Minimal subsegmental  atelectasis at lung bases. Lungs otherwise clear. No pulmonary infiltrate, pleural effusion, or pneumothorax. Upper Abdomen: VP shunt tubing traverses anterior RIGHT chest wall into abdomen. Gallbladder surgically absent. Visualized upper abdomen otherwise unremarkable Musculoskeletal: Osseous demineralization. Review of the MIP images confirms the above findings. IMPRESSION: Single small pulmonary embolus in a subsegmental RIGHT lower lobe pulmonary artery. Minimal subsegmental atelectasis at lung bases. Large hiatal hernia. Aortic Atherosclerosis (ICD10-I70.0). Findings called to Dr. Katrinka Blazing on 04/02/2021 at 1018 hours. Electronically Signed   By: Ulyses Southward M.D.   On: 04/02/2021 10:18   US Venous Img Lower Unilateral Right  Result Date: 04/02/2021 CLINICAL DATA:  87 year old female with right lower extremity swelling. EXAM: RIGHT LOWER EXTREMITY VENOUS DOPPLER ULTRASOUND TECHNIQUE: Gray-scale sonography with graded compression, as well as color Doppler and duplex ultrasound were performed to evaluate the right lower extremity deep venous systems from the level of the common femoral vein and including the common femoral, femoral, profunda femoral, popliteal and calf veins including the posterior tibial, peroneal and gastrocnemius veins when visible. Spectral Doppler was utilized to evaluate flow at rest and with distal augmentation maneuvers in the common femoral, femoral and popliteal veins. The contralateral common femoral vein was also evaluated for comparison. COMPARISON:  None. FINDINGS: RIGHT LOWER EXTREMITY Common Femoral Vein: Acute, expansile, occlusive thrombus. Central Greater Saphenous Vein: Acute, expansile, occlusive thrombus. Central Profunda Femoral Vein: Acute, expansile, occlusive thrombus. Femoral Vein: Acute, expansile, occlusive thrombus. Popliteal Vein: Acute, expansile, mostly occlusive thrombus. Calf Veins: Acute, expansile, occlusive thrombus. Other Findings:  Subcutaneous edema about  the calf. LEFT LOWER EXTREMITY Common Femoral Vein: No evidence of thrombus. Normal compressibility, respiratory phasicity and response to augmentation. IMPRESSION: Acute deep vein thrombosis extending from the calf veins to the common femoral vein. The central aspect of thrombus is not identified. Marliss Coots, MD [facility]iology Electronically Signed   By: Marliss Coots MD   On: 04/02/2021 09:29     EKG: I have personally reviewed.  Sinus rhythm, QTC 471, nonspecific T wave change, low voltage in V3-V4  Assessment/Plan Principal Problem:   Pulmonary embolism (HCC) Active Problems:   Essential hypertension   DVT (deep venous thrombosis) (HCC)_right leg   Other emphysema (HCC)   COVID-19 virus infection   Pulmonary embolism and DVT (deep venous thrombosis) (HCC)_right leg:  Lower extremity Doppler showed extensive right leg DVT.  CTA of chest showed small subsegmental right lower lobe PE.  No oxygen desaturation and chest pain.  Only has mild shortness breath which seem to be a chronic issue.  Dr. Gilda Crease of the [facility] is consulted, planning to do  thrombectomy tomorrow.  -Will place on med-surg bed for obs -heparin drip initiated -prn albuterol nebs and mucinex  -NPO after MN  Essential hypertension -Amlodipine, metoprolol -IV hydralazine as needed  Other emphysema (HCC) -prn Albuterol and Mucinex  COVID-19 virus infection: Pt had positive rapid COVID test on 02/06/2021, and negative PCR test on April/22/22 per her daughter.  Today patient has positive COVID PCR, basically asymptomatic, CT value was 39.8, which is outside of infectious range per infectious control.  No isolation needed. -No treatment for COVID needed.   DVT ppx: on IV Heparin   Code Status: DNR (patient has DNR document from facility) Family Communication: Yes, patient's daughter at the bedside   Disposition Plan:  Anticipate discharge back to previous  environment Consults called:  Dr. Gilda Crease of [facility] Admission status and Level of care: Med-Surg:   for obs  Status is: Observation  The patient remains OBS appropriate and will d/c before 2 midnights.  Dispo: The patient is from: SNF              Anticipated d/c is to: SNF              Patient currently is not medically stable to d/c.   Difficult to place patient No           Date of Service 04/02/2021    Lorretta Harp [facility]   If 7PM-7AM, please contact night-coverage [email or web address] 04/02/2021, 1:12 PM

## 2021-04-02 NOTE — Progress Notes (Signed)
Patient arrived to unit via bed with daughter at bedside in stable condition. Pt alert and oriented, cooperative. Seen by cardiovascular team at bedside.

## 2021-04-02 NOTE — ED Triage Notes (Signed)
Pt to ED [facility] from [facility] for right leg redness and swelling. Hx dementia.  NAD noted Disoriented to situation, time

## 2021-04-02 NOTE — Consult Note (Signed)
ANTICOAGULATION CONSULT NOTE  Pharmacy Consult for Heparin  Indication: pulmonary embolus  Allergies  Allergen Reactions  . Ciprofloxacin Hives    Patient Measurements: Height: 4\' 11"  (149.9 cm) Weight: 51.9 kg (114 lb 8 oz) IBW/kg (Calculated) : 43.2 Heparin Dosing Weight: 51.9 kg  Vital Signs: Temp: 97.8 F (36.6 C) (06/06 0811) Temp Source: Axillary (06/06 0811) BP: 162/70 (06/06 0811) Pulse Rate: 84 (06/06 0811)  Labs: Recent Labs    04/02/21 0814  HGB 11.6*  HCT 35.3*  PLT 187  LABPROT 14.3  INR 1.1  CREATININE 0.73    Estimated Creatinine Clearance: 31.7 mL/min (by C-G formula based on SCr of 0.73 mg/dL).   Medical History: Past Medical History:  Diagnosis Date  . Bronchitis, not specified as acute or chronic   . Essential (primary) hypertension   . Other emphysema (HCC)   . Tinea unguium   . Urge incontinence     Medications:  (Not in a hospital admission)  Scheduled:   Infusions:   PRN: acetaminophen, albuterol, dextromethorphan-guaiFENesin, hydrALAZINE, ondansetron (ZOFRAN) IV Anti-infectives (From admission, onward)   None      Assessment: Pharmacy consulted to start heparin for PE. No DOAC PTA. CBC stable. Chest CT: Single small pulmonary embolus in a subsegmental RIGHT lower lobe pulmonary artery.   Goal of Therapy:  Heparin level 0.3-0.7 units/ml Monitor platelets by anticoagulation protocol: Yes   Plan:  Give 4200 units bolus x 1 Start heparin infusion at 800 units/hr Check anti-Xa level in 8 hours and daily while on heparin Continue to monitor H&H and platelets  06/02/21, PharmD, BCPS 04/02/2021,10:50 AM

## 2021-04-02 NOTE — Consult Note (Signed)
ANTICOAGULATION CONSULT NOTE  Pharmacy Consult for Heparin  Indication: pulmonary embolus  Allergies  Allergen Reactions  . Ciprofloxacin Hives    Patient Measurements: Height: 4\' 11"  (149.9 cm) Weight: 51.9 kg (114 lb 8 oz) IBW/kg (Calculated) : 43.2 Heparin Dosing Weight: 51.9 kg  Vital Signs: Temp: 98 F (36.7 C) (06/06 2039) Temp Source: Oral (06/06 1602) BP: 150/87 (06/06 2039) Pulse Rate: 85 (06/06 2039)  Labs: Recent Labs    04/02/21 0814 04/02/21 1900  HGB 11.6*  --   HCT 35.3*  --   PLT 187  --   APTT 53*  --   LABPROT 14.3  --   INR 1.1  --   HEPARINUNFRC  --  0.16*  CREATININE 0.73  --     Estimated Creatinine Clearance: 31.7 mL/min (by C-G formula based on SCr of 0.73 mg/dL).   Medical History: Past Medical History:  Diagnosis Date  . Bronchitis, not specified as acute or chronic   . Essential (primary) hypertension   . Other emphysema (HCC)   . Tinea unguium   . Urge incontinence     Medications:  Medications Prior to Admission  Medication Sig Dispense Refill Last Dose  . acetaminophen (TYLENOL) 325 MG tablet Take 650 mg by mouth 3 (three) times daily.   04/01/2021 at 2100  . acetaminophen (TYLENOL) 325 MG tablet Take 325 mg by mouth every 4 (four) hours as needed for mild pain.   Unknown at PRN  . albuterol (VENTOLIN HFA) 108 (90 Base) MCG/ACT inhaler Inhale 1 puff into the lungs 2 (two) times daily.   04/01/2021 at 2100  . amLODipine (NORVASC) 5 MG tablet Take 5 mg by mouth daily.    04/01/2021 at 0900  . ascorbic acid (VITAMIN C) 500 MG tablet Take 1,000 mg by mouth daily.     . benzocaine (ORAJEL) 10 % mucosal gel Use as directed 1 application in the mouth or throat every 4 (four) hours as needed for mouth pain.   Unknown at PRN  . Calcium Carb-Cholecalciferol (OYSTER SHELL CALCIUM) 500-400 MG-UNIT TABS Take 1 tablet by mouth daily.   04/01/2021 at 0900  . metoprolol succinate (TOPROL-XL) 25 MG 24 hr tablet Take 25 mg by mouth at bedtime.     04/01/2021 at 2100   Scheduled:  . amLODipine  5 mg Oral Daily  . ascorbic acid  1,000 mg Oral Daily  . [START ON 04/03/2021] calcium-vitamin D  1 tablet Oral Q breakfast  . metoprolol succinate  25 mg Oral QHS  . [START ON 04/03/2021] pneumococcal 23 valent vaccine  0.5 mL Intramuscular Tomorrow-1000   Infusions:  . [START ON 04/03/2021] sodium chloride    . heparin 1,000 Units/hr (04/02/21 2048)   PRN: acetaminophen, albuterol, benzocaine, dextromethorphan-guaiFENesin, hydrALAZINE, ondansetron (ZOFRAN) IV Anti-infectives (From admission, onward)   None      Assessment: Pharmacy consulted to start heparin for PE. No DOAC PTA. CBC stable. Chest CT: Single small pulmonary embolus in a subsegmental RIGHT lower lobe pulmonary artery.   6/6 1900 HL = 0.16, subtherapeutic @ 800 units/hr  Goal of Therapy:  Heparin level 0.3-0.7 units/ml Monitor platelets by anticoagulation protocol: Yes   Plan:  Heparin subtherapeutic Give 1500 units bolus x 1 Increase heparin infusion to 1000 units/hr Check anti-Xa level in 8 hours following rate change  Continue to monitor H&H and platelets  06/02/21, PharmD, BCPS 04/02/2021,9:00 PM

## 2021-04-02 NOTE — ED Provider Notes (Signed)
[facility] Emergency Department Provider Note  ____________________________________________   Event Date/Time   First MD Initiated Contact with Patient 04/02/21 [phone]     (approximate)  I have reviewed the triage vital signs and the nursing notes.   HISTORY  Chief Complaint Leg Swelling   HPI Anne Harrison is a 87 y.o. female with a past medical history of HTN, emphysema and dementia who presents via EMS from nursing facility for assessment of some right leg swelling and redness.  Patient is demented and unable provide any significant contributory history.  She is not oriented.  Per staff this is baseline.  Per staff they noticed the swelling in her right leg this morning.  She has not had any recent falls or injuries, fevers, cough, vomiting, diarrhea, dysuria, rash or any other sick symptoms as far staff is aware.         Past Medical History:  Diagnosis Date  . Bronchitis, not specified as acute or chronic   . Essential (primary) hypertension   . Other emphysema (HCC)   . Tinea unguium   . Urge incontinence     Patient Active Problem List   Diagnosis Date Noted  . Onychomycosis 10/22/2017  . Essential hypertension 10/07/2017    No past surgical history on file.  Prior to Admission medications   Medication Sig Start Date End Date Taking? Authorizing Provider  acetaminophen (TYLENOL) 325 MG tablet Take 650 mg by mouth 3 (three) times daily.   Yes [provider]  acetaminophen (TYLENOL) 325 MG tablet Take 325 mg by mouth every 4 (four) hours as needed for mild pain.   Yes [provider]  albuterol (VENTOLIN HFA) 108 (90 Base) MCG/ACT inhaler Inhale 1 puff into the lungs 2 (two) times daily.   Yes [provider]  amLODipine (NORVASC) 5 MG tablet Take 5 mg by mouth daily.    Yes [provider]  ascorbic acid (VITAMIN C) 500 MG tablet Take 1,000 mg by mouth daily.   Yes [provider]   benzocaine (ORAJEL) 10 % mucosal gel Use as directed 1 application in the mouth or throat every 4 (four) hours as needed for mouth pain.   Yes [provider]  Calcium Carb-Cholecalciferol (OYSTER SHELL CALCIUM) 500-400 MG-UNIT TABS Take 1 tablet by mouth daily.   Yes [provider]  metoprolol succinate (TOPROL-XL) 25 MG 24 hr tablet Take 25 mg by mouth at bedtime.    Yes [provider]    Allergies Ciprofloxacin  No family history on file.  Social History Social History   Tobacco Use  . Smoking status: Former Games developer  . Smokeless tobacco: Never Used  Vaping Use  . Vaping Use: Never used  Substance Use Topics  . Alcohol use: Not Currently    Alcohol/week: 1.0 - 2.0 standard drink    Types: 1 - 2 Glasses of wine per week    Comment: 1-2 glasses of wine per day  . Drug use: No    Review of Systems  Review of Systems  Unable to perform ROS: Dementia      ____________________________________________   PHYSICAL EXAM:  VITAL SIGNS: ED Triage Vitals  Enc Vitals Group     BP      Pulse      Resp      Temp      Temp src      SpO2      Weight      Height  Head Circumference      Peak Flow      Pain Score      Pain Loc      Pain Edu?      Excl. in GC?    Vitals:   04/02/21 0811 04/02/21 0812  BP: (!) 162/70   Pulse: 84   Resp:  18  Temp: 97.8 F (36.6 C)   SpO2: 97%    Physical Exam Vitals and nursing note reviewed.  Constitutional:      General: She is not in acute distress.    Appearance: She is well-developed.  HENT:     Head: Normocephalic and atraumatic.     Right Ear: External ear normal.     Left Ear: External ear normal.     Nose: Nose normal.  Eyes:     Conjunctiva/sclera: Conjunctivae normal.  Cardiovascular:     Rate and Rhythm: Normal rate and regular rhythm.     Heart sounds: No murmur heard.   Pulmonary:     Effort: Pulmonary effort is normal. Tachypnea present. No respiratory distress.      Breath sounds: Normal breath sounds.  Abdominal:     Palpations: Abdomen is soft.     Tenderness: There is no abdominal tenderness.  Musculoskeletal:     Cervical back: Neck supple.     Right lower leg: Edema present.     Left lower leg: No edema.  Skin:    General: Skin is warm and dry.     Capillary Refill: Capillary refill takes less than 2 seconds.  Neurological:     Mental Status: She is alert. Mental status is at baseline. She is disoriented and confused.     Patient has some edema and erythema circumferentially about the right lower leg just distal to the knee including the top of the ankle.  2+ DP pulses.  There is little ecchymosis over the front of the leg.  No petechiae, induration, fluctuance, crepitus or other significant skin changes.  Patient is able to move her legs with symmetric power. ____________________________________________   LABS (all labs ordered are listed, but only abnormal results are displayed)  Labs Reviewed  CBC WITH DIFFERENTIAL/PLATELET - Abnormal; Notable for the following components:      Result Value   RBC 3.77 (*)    Hemoglobin 11.6 (*)    HCT 35.3 (*)    Monocytes Absolute 2.0 (*)    All other components within normal limits  BASIC METABOLIC PANEL - Abnormal; Notable for the following components:   Glucose, Bld 103 (*)    All other components within normal limits  RESP PANEL BY RT-PCR (FLU A&B, COVID) ARPGX2  PROTIME-INR   ____________________________________________  EKG  Sinus rhythm with a ventricular of 85, normal axis, unremarkable intervals without evidence of acute ischemia. ____________________________________________  RADIOLOGY  ED MD interpretation: The right ankle shows some soft tissue swelling without fracture dislocation.  Ultrasound of the right lower extremity shows DVT.  CTA chest remarkable for subsegmental PE without other clear acute thoracic pathology.  Official radiology report(s): DG Ankle Complete  Right  Result Date: 04/02/2021 CLINICAL DATA:  Soft tissue swelling and redness EXAM: RIGHT ANKLE - COMPLETE 3+ VIEW COMPARISON:  None. FINDINGS: Frontal, oblique, and lateral views were obtained. There is soft tissue swelling. No evident fracture or joint effusion. No joint space narrowing or erosion. There is a small posterior calcaneal spur. Ankle mortise appears intact. No bony destruction. IMPRESSION: Soft tissue swelling. No fracture or appreciable arthropathy.  Bony structures intact. Small posterior calcaneal spur. Electronically Signed   By: Bretta BangWilliam  Woodruff III M.D.   On: 04/02/2021 08:51   US Venous Img Lower Unilateral Right  Result Date: 04/02/2021 CLINICAL DATA:  86 year old female with right lower extremity swelling. EXAM: RIGHT LOWER EXTREMITY VENOUS DOPPLER ULTRASOUND TECHNIQUE: Gray-scale sonography with graded compression, as well as color Doppler and duplex ultrasound were performed to evaluate the right lower extremity deep venous systems from the level of the common femoral vein and including the common femoral, femoral, profunda femoral, popliteal and calf veins including the posterior tibial, peroneal and gastrocnemius veins when visible. Spectral Doppler was utilized to evaluate flow at rest and with distal augmentation maneuvers in the common femoral, femoral and popliteal veins. The contralateral common femoral vein was also evaluated for comparison. COMPARISON:  None. FINDINGS: RIGHT LOWER EXTREMITY Common Femoral Vein: Acute, expansile, occlusive thrombus. Central Greater Saphenous Vein: Acute, expansile, occlusive thrombus. Central Profunda Femoral Vein: Acute, expansile, occlusive thrombus. Femoral Vein: Acute, expansile, occlusive thrombus. Popliteal Vein: Acute, expansile, mostly occlusive thrombus. Calf Veins: Acute, expansile, occlusive thrombus. Other Findings:  Subcutaneous edema about the calf. LEFT LOWER EXTREMITY Common Femoral Vein: No evidence of thrombus. Normal  compressibility, respiratory phasicity and response to augmentation. IMPRESSION: Acute deep vein thrombosis extending from the calf veins to the common femoral vein. The central aspect of thrombus is not identified. Marliss Cootsylan Suttle, MD [facility] Electronically Signed   By: Marliss Cootsylan  Suttle MD   On: 04/02/2021 09:29    ____________________________________________   PROCEDURES  Procedure(s) performed (including Critical Care):  .Critical Care Performed by: Gilles ChiquitoSmith, Pricsilla Lindvall P, MD Authorized by: Gilles ChiquitoSmith, Malcom Selmer P, MD   Critical care provider statement:    Critical care time (minutes):  45   Critical care was necessary to treat or prevent imminent or life-threatening deterioration of the following conditions:  Circulatory failure   Critical care was time spent personally by me on the following activities:  Discussions with consultants, evaluation of patient's response to treatment, examination of patient, ordering and performing treatments and interventions, ordering and review of laboratory studies, ordering and review of radiographic studies, pulse oximetry, re-evaluation of patient's condition, obtaining history from patient or surrogate and review of old charts     ____________________________________________   INITIAL IMPRESSION / ASSESSMENT AND PLAN / ED COURSE      Patient presents with above-stated exam for assessment of some redness and swelling to her right lower leg noticed by staff at her nursing facility earlier today.  She has dementia and is not able to contribute any significant history although I was able to reach staff who states they first noticed that today I do not think patient had any recent injuries or falls or any other associated sick symptoms.  On arrival patient is afebrile and hemodynamically stable although a little hypertensive with BP of 162/70.   Primary differential includes DVT, cellulitis versus soft  tissue injury.  Compartments are soft and there is no history of trauma and I have a low suspicion for compartment syndrome.  While there is edema throughout the lower leg including a little bit of ankle joint she is able to range the ankle and there is no large effusion seen on x-ray she has no fever and overall I have a low suspicion for septic joint at this time.  Ultrasound did not show evidence of DVT.  Given tachypnea CTA obtained.  This is remarkable for positive for PE.  Patient started  on heparin.  Will admit to hospitalist service for further evaluation and management.     ____________________________________________   FINAL CLINICAL IMPRESSION(S) / ED DIAGNOSES  Final diagnoses:  DVT (deep vein thrombosis) in pregnancy  Single subsegmental pulmonary embolism without acute cor pulmonale (HCC)    Medications  iohexol (OMNIPAQUE) 350 MG/ML injection 75 mL (75 mLs Intravenous Contrast Given 04/02/21 0951)     ED Discharge Orders    None       Note:  This document was prepared using Dragon voice recognition software and may include unintentional dictation errors.   Gilles Chiquito, MD 04/02/21 1021

## 2021-04-02 NOTE — Progress Notes (Signed)
Discussed with Dr. Clyde Lundborg. Patient was positive at her SNF on 02/06/21. I checked the CT value and it was over the infectious range. Okay to discontinue isolation.

## 2021-04-03 ENCOUNTER — Encounter
Admission: EM | Disposition: A | Discharge status: Skilled Nursing Facility | Payer: Self-pay | Source: Skilled Nursing Facility | Attending: Student

## 2021-04-03 DIAGNOSIS — I82441 Acute embolism and thrombosis of right tibial vein: Secondary | ICD-10-CM | POA: Diagnosis present

## 2021-04-03 DIAGNOSIS — I2693 Single subsegmental pulmonary embolism without acute cor pulmonale: Secondary | ICD-10-CM | POA: Diagnosis present

## 2021-04-03 DIAGNOSIS — I10 Essential (primary) hypertension: Secondary | ICD-10-CM | POA: Diagnosis present

## 2021-04-03 DIAGNOSIS — I82461 Acute embolism and thrombosis of right calf muscular vein: Secondary | ICD-10-CM | POA: Diagnosis present

## 2021-04-03 DIAGNOSIS — I82421 Acute embolism and thrombosis of right iliac vein: Secondary | ICD-10-CM | POA: Diagnosis not present

## 2021-04-03 DIAGNOSIS — I2782 Chronic pulmonary embolism: Secondary | ICD-10-CM | POA: Diagnosis not present

## 2021-04-03 DIAGNOSIS — Z8616 Personal history of COVID-19: Secondary | ICD-10-CM | POA: Diagnosis not present

## 2021-04-03 DIAGNOSIS — Z23 Encounter for immunization: Secondary | ICD-10-CM | POA: Diagnosis present

## 2021-04-03 DIAGNOSIS — I82431 Acute embolism and thrombosis of right popliteal vein: Secondary | ICD-10-CM | POA: Diagnosis present

## 2021-04-03 DIAGNOSIS — Z8249 Family history of ischemic heart disease and other diseases of the circulatory system: Secondary | ICD-10-CM | POA: Diagnosis not present

## 2021-04-03 DIAGNOSIS — F0390 Unspecified dementia without behavioral disturbance: Secondary | ICD-10-CM | POA: Diagnosis present

## 2021-04-03 DIAGNOSIS — I824Z1 Acute embolism and thrombosis of unspecified deep veins of right distal lower extremity: Secondary | ICD-10-CM | POA: Diagnosis present

## 2021-04-03 DIAGNOSIS — I2699 Other pulmonary embolism without acute cor pulmonale: Secondary | ICD-10-CM | POA: Diagnosis present

## 2021-04-03 DIAGNOSIS — Z9181 History of falling: Secondary | ICD-10-CM | POA: Diagnosis not present

## 2021-04-03 DIAGNOSIS — D638 Anemia in other chronic diseases classified elsewhere: Secondary | ICD-10-CM | POA: Diagnosis present

## 2021-04-03 DIAGNOSIS — Z66 Do not resuscitate: Secondary | ICD-10-CM | POA: Diagnosis present

## 2021-04-03 DIAGNOSIS — J438 Other emphysema: Secondary | ICD-10-CM | POA: Diagnosis present

## 2021-04-03 DIAGNOSIS — I82411 Acute embolism and thrombosis of right femoral vein: Secondary | ICD-10-CM | POA: Diagnosis present

## 2021-04-03 DIAGNOSIS — Z87891 Personal history of nicotine dependence: Secondary | ICD-10-CM | POA: Diagnosis not present

## 2021-04-03 DIAGNOSIS — Z9049 Acquired absence of other specified parts of digestive tract: Secondary | ICD-10-CM | POA: Diagnosis not present

## 2021-04-03 DIAGNOSIS — Z90710 Acquired absence of both cervix and uterus: Secondary | ICD-10-CM | POA: Diagnosis not present

## 2021-04-03 DIAGNOSIS — Z79899 Other long term (current) drug therapy: Secondary | ICD-10-CM | POA: Diagnosis not present

## 2021-04-03 HISTORY — PX: PERIPHERAL VASCULAR THROMBECTOMY: CATH118306

## 2021-04-03 LAB — BASIC METABOLIC PANEL
Anion gap: 8 (ref 5–15)
BUN: 11 mg/dL (ref 8–23)
CO2: 27 mmol/L (ref 22–32)
Calcium: 8.5 mg/dL — ABNORMAL LOW (ref 8.9–10.3)
Chloride: 102 mmol/L (ref 98–111)
Creatinine, Ser: 0.69 mg/dL (ref 0.44–1.00)
GFR, Estimated: >60 mL/min (ref >60)
Glucose, Bld: 83 mg/dL (ref 70–99)
Potassium: 3.8 mmol/L (ref 3.5–5.1)
Sodium: 137 mmol/L (ref 135–145)

## 2021-04-03 LAB — CBC
HCT: 29.8 % — ABNORMAL LOW (ref 36.0–46.0)
Hemoglobin: 9.9 g/dL — ABNORMAL LOW (ref 12.0–15.0)
MCH: 30.7 pg (ref 26.0–34.0)
MCHC: 33.2 g/dL (ref 30.0–36.0)
MCV: 92.5 fL (ref 80.0–100.0)
Platelets: 188 10*3/uL (ref 150–400)
RBC: 3.22 MIL/uL — ABNORMAL LOW (ref 3.87–5.11)
RDW: 14.2 % (ref 11.5–15.5)
WBC: 8.4 10*3/uL (ref 4.0–10.5)
nRBC: 0 % (ref 0.0–0.2)

## 2021-04-03 LAB — HEPARIN LEVEL (UNFRACTIONATED)
Heparin Unfractionated: 0.23 IU/mL — ABNORMAL LOW (ref 0.30–0.70)
Heparin Unfractionated: 0.32 IU/mL (ref 0.30–0.70)

## 2021-04-03 SURGERY — PERIPHERAL VASCULAR THROMBECTOMY
Anesthesia: Moderate Sedation | Laterality: Right

## 2021-04-03 MED ORDER — MIDAZOLAM HCL 2 MG/2ML IJ SOLN
INTRAMUSCULAR | Status: AC
  Filled 2021-04-03: qty 2

## 2021-04-03 MED ORDER — ONDANSETRON HCL 4 MG/2ML IJ SOLN: 4.0000 mg | Freq: Four times a day (QID) | INTRAMUSCULAR | Status: DC | PRN

## 2021-04-03 MED ORDER — SODIUM CHLORIDE 0.9 % IV SOLN
INTRAVENOUS | Status: AC
  Filled 2021-04-03: qty 10

## 2021-04-03 MED ORDER — METHYLPREDNISOLONE SODIUM SUCC 125 MG IJ SOLR: 125.0000 mg | Freq: Once | INTRAMUSCULAR | Status: DC | PRN

## 2021-04-03 MED ORDER — HYDROMORPHONE HCL 1 MG/ML IJ SOLN
1.0000 mg | Freq: Once | INTRAMUSCULAR | Status: AC | PRN
Start: 2021-04-03 — End: 2021-04-03
  Administered 2021-04-03: 20:00:00 1 mg via INTRAVENOUS
  Filled 2021-04-03: qty 1

## 2021-04-03 MED ORDER — ALTEPLASE 2 MG IJ SOLR
INTRAMUSCULAR | Status: DC | PRN
  Administered 2021-04-03: 8 mg

## 2021-04-03 MED ORDER — HEPARIN (PORCINE) 25000 UT/250ML-% IV SOLN
1300.0000 [IU]/h | INTRAVENOUS | Status: DC
  Administered 2021-04-03: 1100 [IU]/h via INTRAVENOUS
  Filled 2021-04-03 (×2): qty 250

## 2021-04-03 MED ORDER — HEPARIN SODIUM (PORCINE) 1000 UNIT/ML IJ SOLN
INTRAMUSCULAR | Status: AC
  Filled 2021-04-03: qty 1

## 2021-04-03 MED ORDER — FAMOTIDINE 20 MG PO TABS: 40.0000 mg | ORAL_TABLET | Freq: Once | ORAL | Status: DC | PRN

## 2021-04-03 MED ORDER — MIDAZOLAM HCL 2 MG/ML PO SYRP: 8.0000 mg | ORAL_SOLUTION | Freq: Once | ORAL | Status: DC | PRN

## 2021-04-03 MED ORDER — IODIXANOL 320 MG/ML IV SOLN
INTRAVENOUS | Status: DC | PRN
  Administered 2021-04-03: 20 mL via INTRAVENOUS

## 2021-04-03 MED ORDER — DIPHENHYDRAMINE HCL 50 MG/ML IJ SOLN: 50.0000 mg | Freq: Once | INTRAMUSCULAR | Status: DC | PRN

## 2021-04-03 MED ORDER — FENTANYL CITRATE (PF) 100 MCG/2ML IJ SOLN
INTRAMUSCULAR | Status: AC
  Filled 2021-04-03: qty 2

## 2021-04-03 MED ORDER — FENTANYL CITRATE (PF) 100 MCG/2ML IJ SOLN
INTRAMUSCULAR | Status: DC | PRN
  Administered 2021-04-03: 12.5 ug via INTRAVENOUS

## 2021-04-03 MED ORDER — ALTEPLASE 2 MG IJ SOLR
INTRAMUSCULAR | Status: AC
  Filled 2021-04-03: qty 10

## 2021-04-03 MED ORDER — SODIUM CHLORIDE 0.9 % IV SOLN: INTRAVENOUS | Status: DC

## 2021-04-03 MED ORDER — MIDAZOLAM HCL 2 MG/2ML IJ SOLN
INTRAMUSCULAR | Status: DC | PRN
  Administered 2021-04-03: 1 mg via INTRAVENOUS
  Administered 2021-04-03: 0.5 mg via INTRAVENOUS

## 2021-04-03 MED ORDER — HEPARIN BOLUS VIA INFUSION
750.0000 [IU] | Freq: Once | INTRAVENOUS | Status: AC
  Administered 2021-04-03: 750 [IU] via INTRAVENOUS
  Filled 2021-04-03: qty 750

## 2021-04-03 MED ORDER — CEFAZOLIN SODIUM-DEXTROSE 2-4 GM/100ML-% IV SOLN
INTRAVENOUS | Status: AC
  Administered 2021-04-03 (×2): 1 g
  Filled 2021-04-03: qty 100

## 2021-04-03 SURGICAL SUPPLY — 18 items
BALLN DORADO 8X100X80 (BALLOONS) ×4
BALLOON DORADO 8X100X80 (BALLOONS) IMPLANT
CANISTER PENUMBRA ENGINE (MISCELLANEOUS) ×1 IMPLANT
CANNULA 5F STIFF (CANNULA) ×1 IMPLANT
CATH INDIGO SEP 8 (CATHETERS) ×1 IMPLANT
CATH INFUS 90CMX50CM (CATHETERS) ×1
CATH INFUS UNIFUSE 90X50 5FR (CATHETERS) IMPLANT
CATH KUMPE SOFT-VU 5FR 65 (CATHETERS) ×1 IMPLANT
CATH LIGHTNING 8 XTORQ 115 (CATHETERS) ×1 IMPLANT
COVER PROBE U/S 5X48 (MISCELLANEOUS) ×1 IMPLANT
DRAPE BRACHIAL (DRAPES) ×1 IMPLANT
GLIDEWIRE STIFF .35X180X3 HYDR (WIRE) ×1 IMPLANT
GUIDEWIRE VERSACORE 260 (WIRE) ×1 IMPLANT
KIT ENCORE 26 ADVANTAGE (KITS) ×1 IMPLANT
KIT MICROPUNCTURE NIT STIFF (SHEATH) ×1 IMPLANT
PACK ANGIOGRAPHY (CUSTOM PROCEDURE TRAY) ×2 IMPLANT
SHEATH BRITE TIP 8FRX11 (SHEATH) ×1 IMPLANT
TOWEL OR 17X26 4PK STRL BLUE (TOWEL DISPOSABLE) ×1 IMPLANT

## 2021-04-03 NOTE — Consult Note (Signed)
[facility] Vascular Consult Note  MRN : [id number]  Anne Harrison is a 87 y.o. (01-08-1924) female who presents with chief complaint of  Chief Complaint  Patient presents with  . Leg Swelling   History of Present Illness:  Anne Harrison is a 87 y.o. female with medical history significant of hypertension, emphysema, dementia, uterine incontinence, who presents with right leg swelling.  Per her daughter at the bedside, pt was noticed to have right leg swelling and redness by staff in facility this morning.  Patient does not have fever or chills.  Patient has mild pain in right leg.  She reports mild dry cough, mild intermittent shortness breath recently, which has not changed.  Denies any chest pain.  No nausea, vomiting, diarrhea or abdominal pain.  No symptoms of UTI.  Per her daughter, patient fell in April 2022, and had negative CT scan of head.  After that, patient did not have new fall or injury.  No rectal bleeding or dark stool.  Patient has dementia, mental status is at her baseline per her daughter.  Daughter reported that the patient had a positive COVID-19 rapid test on April/12/22, 10 days later patient had negative COVID PCR.  ED Course: pt was found to have WBC 9.3, INR 1.1, postiver COVID-19 PCR, electrolytes renal function okay, temperature normal, blood pressure 162/70, heart rate 84, RR 18, oxygen saturation 97% on room air.  X-ray of left ankle is negative for bony fracture.  Lower extremity Doppler showed extensive right leg DVT.  CTA of chest showed small subsegmental right lower lobe PE.  Patient is placed on MedSurg bed of observation.    Vascular surgery was consulted by Dr. Clyde Lundborg for extensive right lower extremity DVT and pulmonary embolism.  Current Facility-Administered Medications  Medication Dose Route Frequency Provider Last Rate Last Admin  . 0.9 %  sodium chloride infusion   Intravenous Continuous Jodean Valade, Ranae Plumber, PA-C 75  mL/hr at 04/03/21 0108 New Bag at 04/03/21 0108  . acetaminophen (TYLENOL) suppository 650 mg  650 mg Rectal Q6H PRN Lorretta Harp, MD      . albuterol (PROVENTIL) (2.5 MG/3ML) 0.083% nebulizer solution 2.5 mg  2.5 mg Nebulization Q4H PRN Lorretta Harp, MD      . amLODipine (NORVASC) tablet 5 mg  5 mg Oral Daily Lorretta Harp, MD   5 mg at 04/02/21 1502  . ascorbic acid (VITAMIN C) tablet 1,000 mg  1,000 mg Oral Daily Lorretta Harp, MD   1,000 mg at 04/02/21 1502  . benzocaine (ORAJEL) 10 % mucosal gel 1 application  1 application Mouth/Throat Q4H PRN Lorretta Harp, MD      . calcium-vitamin D (OSCAL WITH D) 500-200 MG-UNIT per tablet 1 tablet  1 tablet Oral Q breakfast Lorretta Harp, MD      . dextromethorphan-guaiFENesin [facility] DM) 30-600 MG per 12 hr tablet 1 tablet  1 tablet Oral BID PRN Lorretta Harp, MD      . heparin ADULT infusion 100 units/mL (25000 units/218mL)  1,100 Units/hr Intravenous Continuous Lorretta Harp, MD 11 mL/hr at 04/03/21 0628 1,100 Units/hr at 04/03/21 0628  . hydrALAZINE (APRESOLINE) injection 5 mg  5 mg Intravenous Q2H PRN Lorretta Harp, MD      . metoprolol succinate (TOPROL-XL) 24 hr tablet 25 mg  25 mg Oral QHS Lorretta Harp, MD   25 mg at 04/02/21 2102  . ondansetron (ZOFRAN) injection 4 mg  4 mg Intravenous Q8H PRN Lorretta Harp, MD      .  pneumococcal 23 valent vaccine (PNEUMOVAX-23) injection 0.5 mL  0.5 mL Intramuscular Tomorrow-1000 Lorretta Harp, MD       Past Medical History:  Diagnosis Date  . Bronchitis, not specified as acute or chronic   . Essential (primary) hypertension   . Other emphysema (HCC)   . Tinea unguium   . Urge incontinence    Past Surgical History:  Procedure Laterality Date  . ABDOMINAL HYSTERECTOMY    . CHOLECYSTECTOMY     Social History Social History   Tobacco Use  . Smoking status: Former Games developer  . Smokeless tobacco: Never Used  Vaping Use  . Vaping Use: Never used  Substance Use Topics  . Alcohol use: Not Currently    Alcohol/week: 1.0 - 2.0 standard  drink    Types: 1 - 2 Glasses of wine per week    Comment: 1-2 glasses of wine per day  . Drug use: No   Family History Family History  Problem Relation Age of Onset  . Hypertension Mother   . Heart attack Father   Denies family history of peripheral artery disease venous disease or renal disease.  Allergies  Allergen Reactions  . Ciprofloxacin Hives   REVIEW OF SYSTEMS (Negative unless checked)  Constitutional: [] Weight loss  [] Fever  [] Chills Cardiac: [] Chest pain   [] Chest pressure   [] Palpitations   [] Shortness of breath when laying flat   [] Shortness of breath at rest   [] Shortness of breath with exertion. Vascular:  [x] Pain in legs with walking   [x] Pain in legs at rest   [] Pain in legs when laying flat   [] Claudication   [] Pain in feet when walking  [] Pain in feet at rest  [] Pain in feet when laying flat   [] History of DVT   [] Phlebitis   [x] Swelling in legs   [] Varicose veins   [] Non-healing ulcers Pulmonary:   [] Uses home oxygen   [] Productive cough   [] Hemoptysis   [] Wheeze  [] COPD   [] Asthma Neurologic:  [] Dizziness  [] Blackouts   [] Seizures   [] History of stroke   [] History of TIA  [] Aphasia   [] Temporary blindness   [] Dysphagia   [] Weakness or numbness in arms   [] Weakness or numbness in legs Musculoskeletal:  [] Arthritis   [] Joint swelling   [] Joint pain   [] Low back pain Hematologic:  [] Easy bruising  [] Easy bleeding   [] Hypercoagulable state   [] Anemic  [] Hepatitis Gastrointestinal:  [] Blood in stool   [] Vomiting blood  [] Gastroesophageal reflux/heartburn   [] Difficulty swallowing. Genitourinary:  [] Chronic kidney disease   [] Difficult urination  [] Frequent urination  [] Burning with urination   [] Blood in urine Skin:  [] Rashes   [] Ulcers   [] Wounds Psychological:  [] History of anxiety   []  History of major depression.  Physical Examination  Vitals:   04/02/21 1602 04/02/21 2039 04/03/21 0037 04/03/21 0435  BP: (!) 148/66 (!) 150/87 119/65 (!) 108/49  Pulse: 82 85 77  (!) 59  Resp: 14 20 20 19   Temp: 98.1 F (36.7 C) 98 F (36.7 C) 97.7 F (36.5 C) 98.3 F (36.8 C)  TempSrc: Oral     SpO2: 95% 96% 93% 91%  Weight:      Height:       Body mass index is 23.13 kg/m. Gen:  WD/WN, NAD Head: [place]/AT, No temporalis wasting. Prominent temp pulse not noted. Ear/Nose/Throat: Hearing grossly intact, nares w/o erythema or drainage, oropharynx w/o Erythema/Exudate Eyes: Sclera non-icteric, conjunctiva clear Neck: Trachea midline.  No JVD.  Pulmonary:  Good air movement,  respirations not labored, equal bilaterally.  Cardiac: RRR, normal S1, S2. Vascular:  Vessel Right Left  Radial Palpable Palpable  Ulnar Palpable Palpable  Brachial Palpable Palpable  Carotid Palpable, without bruit Palpable, without bruit  Aorta Not palpable N/A  Femoral Palpable Palpable  Popliteal Palpable Palpable  PT Non-Palpable Palpable  DP Non-Palpable Palpable   Right lower extremity: Moderate edema.  Thigh soft.  Calf soft.  There is no acute vascular compromise noted to extremity at this time.  Hard to palpate pedal pulses due to edema however the extremity is warm.  Motor/sensory is intact and there is a good capillary refill.  Gastrointestinal: soft, non-tender/non-distended. No guarding/reflex.  Musculoskeletal: M/S 5/5 throughout.  Extremities without ischemic changes.  No deformity or atrophy. No edema. Neurologic: Sensation grossly intact in extremities.  Symmetrical.  Speech is fluent. Motor exam as listed above. Psychiatric: Judgment intact, Mood & affect appropriate for pt's clinical situation. Dermatologic: No rashes or ulcers noted.  No cellulitis or open wounds. Lymph : No Cervical, Axillary, or Inguinal lymphadenopathy.  CBC Lab Results  Component Value Date   WBC 8.4 04/03/2021   HGB 9.9 (L) 04/03/2021   HCT 29.8 (L) 04/03/2021   MCV 92.5 04/03/2021   PLT 188 04/03/2021   BMET    Component Value Date/Time   NA 137 04/03/2021 0519   K 3.8 04/03/2021  0519   CL 102 04/03/2021 0519   CO2 27 04/03/2021 0519   GLUCOSE 83 04/03/2021 0519   BUN 11 04/03/2021 0519   CREATININE 0.69 04/03/2021 0519   CALCIUM 8.5 (L) 04/03/2021 0519   GFRNONAA >60 04/03/2021 0519   GFRAA >60 07/11/2020 1245   Estimated Creatinine Clearance: 31.7 mL/min (by C-G formula based on SCr of 0.69 mg/dL).  COAG Lab Results  Component Value Date   INR 1.1 04/02/2021   INR 0.95 11/27/2018   Radiology DG Ankle Complete Right  Result Date: 04/02/2021 CLINICAL DATA:  Soft tissue swelling and redness EXAM: RIGHT ANKLE - COMPLETE 3+ VIEW COMPARISON:  None. FINDINGS: Frontal, oblique, and lateral views were obtained. There is soft tissue swelling. No evident fracture or joint effusion. No joint space narrowing or erosion. There is a small posterior calcaneal spur. Ankle mortise appears intact. No bony destruction. IMPRESSION: Soft tissue swelling. No fracture or appreciable arthropathy. Bony structures intact. Small posterior calcaneal spur. Electronically Signed   By: Bretta Bang III M.D.   On: 04/02/2021 08:51   CT Angio Chest PE W and/or Wo Contrast  Result Date: 04/02/2021 CLINICAL DATA:  RIGHT leg redness and swelling, dementia, high clinical suspicion of pulmonary embolism EXAM: CT ANGIOGRAPHY CHEST WITH CONTRAST TECHNIQUE: Multidetector CT imaging of the chest was performed using the standard protocol during bolus administration of intravenous contrast. Multiplanar CT image reconstructions and MIPs were obtained to evaluate the vascular anatomy. CONTRAST:  17mL OMNIPAQUE IOHEXOL 350 MG/ML SOLN IV COMPARISON:  None FINDINGS: Cardiovascular: Atherosclerotic calcifications aorta, coronary arteries and proximal great vessels. Aorta normal caliber without aneurysm or dissection. Thrombus identified within descending thoracic aorta. Pulmonary arteries well opacified. Single small pulmonary embolus identified within a subsegmental RIGHT lower lobe pulmonary artery. Remaining  pulmonary arteries patent. Central pulmonary arteries are borderline dilated. Minimal pericardial fluid. Cardiac chambers unremarkable. Mediastinum/Nodes: Large hiatal hernia. Esophagus unremarkable. Few normal size mediastinal lymph nodes. No thoracic adenopathy. Lungs/Pleura: Minimal subsegmental atelectasis at lung bases. Lungs otherwise clear. No pulmonary infiltrate, pleural effusion, or pneumothorax. Upper Abdomen: VP shunt tubing traverses anterior RIGHT chest wall into abdomen. Gallbladder  surgically absent. Visualized upper abdomen otherwise unremarkable Musculoskeletal: Osseous demineralization. Review of the MIP images confirms the above findings. IMPRESSION: Single small pulmonary embolus in a subsegmental RIGHT lower lobe pulmonary artery. Minimal subsegmental atelectasis at lung bases. Large hiatal hernia. Aortic Atherosclerosis (ICD10-I70.0). Findings called to Dr. Smith on 04/02/2021 at 1018 hours. Electronically Signed   By: Mark  Boles M.D.   On: 04/02/2021 10:18   US Venous Img Lower Unilateral Right  Result Date: 04/02/2021 CLINICAL DATA:  89-year-old female with right lower extremity swelling. EXAM: RIGHT LOWER EXTREMITY VENOUS DOPPLER ULTRASOUND TECHNIQUE: Gray-scale sonography with graded compression, as well as color Doppler and duplex ultrasound were performed to evaluate the right lower extremity deep venous systems from the level of the common femoral vein and including the common femoral, femoral, profunda femoral, popliteal and calf veins including the posterior tibial, peroneal and gastrocnemius veins when visible. Spectral Doppler was utilized to evaluate flow at rest and with distal augmentation maneuvers in the common femoral, femoral and popliteal veins. The contralateral common femoral vein was also evaluated for comparison. COMPARISON:  None. FINDINGS: RIGHT LOWER EXTREMITY Common Femoral Vein: Acute, expansile, occlusive thrombus. Central Greater Saphenous Vein: Acute,  expansile, occlusive thrombus. Central Profunda Femoral Vein: Acute, expansile, occlusive thrombus. Femoral Vein: Acute, expansile, occlusive thrombus. Popliteal Vein: Acute, expansile, mostly occlusive thrombus. Calf Veins: Acute, expansile, occlusive thrombus. Other Findings:  Subcutaneous edema about the calf. LEFT LOWER EXTREMITY Common Femoral Vein: No evidence of thrombus. Normal compressibility, respiratory phasicity and response to augmentation. IMPRESSION: Acute deep vein thrombosis extending from the calf veins to the common femoral vein. The central aspect of thrombus is not identified. Anne Suttle, MD [facility] Electronically Signed   By: Anne  Suttle MD   On: 04/02/2021 09:29   Assessment/Plan Anne Harrison is a 87 y.o. female with medical history significant of hypertension, emphysema, dementia, uterine incontinence, who presents with right leg swelling found to have a extensive right lower extremity DVT and pulmonary embolism.  1.  Extensive right lower extremity DVT: Patient presents with a unilateral swelling to the right lower extremity.  Venous duplex was notable for acute deep vein thrombosis extending from the calf veins to the common femoral vein.  On exam, there is no acute vascular compromise warranting emergent intervention however the patient would benefit from a right lower extremity venous thrombectomy and thrombolysis and attempt to lessen the clot burden, reduce the risk of worsening PAD and decrease his postphlebitic symptoms.  Procedure, risks and benefits were explained to the patient and her daughter was at the bedside.  All questions were answered.  The patient and her daughter wish to proceed.  We will plan on this today.  2.  Pulmonary embolism: CTA was notable for single small pulmonary embolus in the segmental right lower lobe. Patient is asymptomatic denies any shortness of breath or chest pain.   Satting upper 90s on room air. Due to the small size and lack of symptoms do not feel there is any indication for pulmonary intervention at this time  3.  Need for anticoagulation: Agree with the initiation of heparin Will need to transition to oral anticoagulation most likely Eliquis for at least a year with her diagnosis of pulmonary embolism Daughter expresses her understanding  Discussed with Dr. Schnier  Anne Schaab A Chrishana Spargur, PA-C  04/03/2021 9:16 AM  This note was created with Dragon medical transcription system.  Any error is purely unintentional 

## 2021-04-03 NOTE — H&P (View-Only) (Signed)
[facility] Vascular Consult Note  MRN : [id number]  Anne Harrison is a 87 y.o. (01-08-1924) female who presents with chief complaint of  Chief Complaint  Patient presents with  . Leg Swelling   History of Present Illness:  Anne Harrison is a 87 y.o. female with medical history significant of hypertension, emphysema, dementia, uterine incontinence, who presents with right leg swelling.  Per her daughter at the bedside, pt was noticed to have right leg swelling and redness by staff in facility this morning.  Patient does not have fever or chills.  Patient has mild pain in right leg.  She reports mild dry cough, mild intermittent shortness breath recently, which has not changed.  Denies any chest pain.  No nausea, vomiting, diarrhea or abdominal pain.  No symptoms of UTI.  Per her daughter, patient fell in April 2022, and had negative CT scan of head.  After that, patient did not have new fall or injury.  No rectal bleeding or dark stool.  Patient has dementia, mental status is at her baseline per her daughter.  Daughter reported that the patient had a positive COVID-19 rapid test on April/12/22, 10 days later patient had negative COVID PCR.  ED Course: pt was found to have WBC 9.3, INR 1.1, postiver COVID-19 PCR, electrolytes renal function okay, temperature normal, blood pressure 162/70, heart rate 84, RR 18, oxygen saturation 97% on room air.  X-ray of left ankle is negative for bony fracture.  Lower extremity Doppler showed extensive right leg DVT.  CTA of chest showed small subsegmental right lower lobe PE.  Patient is placed on MedSurg bed of observation.    Vascular surgery was consulted by Dr. Clyde Lundborg for extensive right lower extremity DVT and pulmonary embolism.  Current Facility-Administered Medications  Medication Dose Route Frequency Provider Last Rate Last Admin  . 0.9 %  sodium chloride infusion   Intravenous Continuous Johnell Bas, Ranae Plumber, PA-C 75  mL/hr at 04/03/21 0108 New Bag at 04/03/21 0108  . acetaminophen (TYLENOL) suppository 650 mg  650 mg Rectal Q6H PRN Lorretta Harp, MD      . albuterol (PROVENTIL) (2.5 MG/3ML) 0.083% nebulizer solution 2.5 mg  2.5 mg Nebulization Q4H PRN Lorretta Harp, MD      . amLODipine (NORVASC) tablet 5 mg  5 mg Oral Daily Lorretta Harp, MD   5 mg at 04/02/21 1502  . ascorbic acid (VITAMIN C) tablet 1,000 mg  1,000 mg Oral Daily Lorretta Harp, MD   1,000 mg at 04/02/21 1502  . benzocaine (ORAJEL) 10 % mucosal gel 1 application  1 application Mouth/Throat Q4H PRN Lorretta Harp, MD      . calcium-vitamin D (OSCAL WITH D) 500-200 MG-UNIT per tablet 1 tablet  1 tablet Oral Q breakfast Lorretta Harp, MD      . dextromethorphan-guaiFENesin [facility] DM) 30-600 MG per 12 hr tablet 1 tablet  1 tablet Oral BID PRN Lorretta Harp, MD      . heparin ADULT infusion 100 units/mL (25000 units/218mL)  1,100 Units/hr Intravenous Continuous Lorretta Harp, MD 11 mL/hr at 04/03/21 0628 1,100 Units/hr at 04/03/21 0628  . hydrALAZINE (APRESOLINE) injection 5 mg  5 mg Intravenous Q2H PRN Lorretta Harp, MD      . metoprolol succinate (TOPROL-XL) 24 hr tablet 25 mg  25 mg Oral QHS Lorretta Harp, MD   25 mg at 04/02/21 2102  . ondansetron (ZOFRAN) injection 4 mg  4 mg Intravenous Q8H PRN Lorretta Harp, MD      .  pneumococcal 23 valent vaccine (PNEUMOVAX-23) injection 0.5 mL  0.5 mL Intramuscular Tomorrow-1000 Lorretta Harp, MD       Past Medical History:  Diagnosis Date  . Bronchitis, not specified as acute or chronic   . Essential (primary) hypertension   . Other emphysema (HCC)   . Tinea unguium   . Urge incontinence    Past Surgical History:  Procedure Laterality Date  . ABDOMINAL HYSTERECTOMY    . CHOLECYSTECTOMY     Social History Social History   Tobacco Use  . Smoking status: Former Games developer  . Smokeless tobacco: Never Used  Vaping Use  . Vaping Use: Never used  Substance Use Topics  . Alcohol use: Not Currently    Alcohol/week: 1.0 - 2.0 standard  drink    Types: 1 - 2 Glasses of wine per week    Comment: 1-2 glasses of wine per day  . Drug use: No   Family History Family History  Problem Relation Age of Onset  . Hypertension Mother   . Heart attack Father   Denies family history of peripheral artery disease venous disease or renal disease.  Allergies  Allergen Reactions  . Ciprofloxacin Hives   REVIEW OF SYSTEMS (Negative unless checked)  Constitutional: [] Weight loss  [] Fever  [] Chills Cardiac: [] Chest pain   [] Chest pressure   [] Palpitations   [] Shortness of breath when laying flat   [] Shortness of breath at rest   [] Shortness of breath with exertion. Vascular:  [x] Pain in legs with walking   [x] Pain in legs at rest   [] Pain in legs when laying flat   [] Claudication   [] Pain in feet when walking  [] Pain in feet at rest  [] Pain in feet when laying flat   [] History of DVT   [] Phlebitis   [x] Swelling in legs   [] Varicose veins   [] Non-healing ulcers Pulmonary:   [] Uses home oxygen   [] Productive cough   [] Hemoptysis   [] Wheeze  [] COPD   [] Asthma Neurologic:  [] Dizziness  [] Blackouts   [] Seizures   [] History of stroke   [] History of TIA  [] Aphasia   [] Temporary blindness   [] Dysphagia   [] Weakness or numbness in arms   [] Weakness or numbness in legs Musculoskeletal:  [] Arthritis   [] Joint swelling   [] Joint pain   [] Low back pain Hematologic:  [] Easy bruising  [] Easy bleeding   [] Hypercoagulable state   [] Anemic  [] Hepatitis Gastrointestinal:  [] Blood in stool   [] Vomiting blood  [] Gastroesophageal reflux/heartburn   [] Difficulty swallowing. Genitourinary:  [] Chronic kidney disease   [] Difficult urination  [] Frequent urination  [] Burning with urination   [] Blood in urine Skin:  [] Rashes   [] Ulcers   [] Wounds Psychological:  [] History of anxiety   []  History of major depression.  Physical Examination  Vitals:   04/02/21 1602 04/02/21 2039 04/03/21 0037 04/03/21 0435  BP: (!) 148/66 (!) 150/87 119/65 (!) 108/49  Pulse: 82 85 77  (!) 59  Resp: 14 20 20 19   Temp: 98.1 F (36.7 C) 98 F (36.7 C) 97.7 F (36.5 C) 98.3 F (36.8 C)  TempSrc: Oral     SpO2: 95% 96% 93% 91%  Weight:      Height:       Body mass index is 23.13 kg/m. Gen:  WD/WN, NAD Head: [place]/AT, No temporalis wasting. Prominent temp pulse not noted. Ear/Nose/Throat: Hearing grossly intact, nares w/o erythema or drainage, oropharynx w/o Erythema/Exudate Eyes: Sclera non-icteric, conjunctiva clear Neck: Trachea midline.  No JVD.  Pulmonary:  Good air movement,  respirations not labored, equal bilaterally.  Cardiac: RRR, normal S1, S2. Vascular:  Vessel Right Left  Radial Palpable Palpable  Ulnar Palpable Palpable  Brachial Palpable Palpable  Carotid Palpable, without bruit Palpable, without bruit  Aorta Not palpable N/A  Femoral Palpable Palpable  Popliteal Palpable Palpable  PT Non-Palpable Palpable  DP Non-Palpable Palpable   Right lower extremity: Moderate edema.  Thigh soft.  Calf soft.  There is no acute vascular compromise noted to extremity at this time.  Hard to palpate pedal pulses due to edema however the extremity is warm.  Motor/sensory is intact and there is a good capillary refill.  Gastrointestinal: soft, non-tender/non-distended. No guarding/reflex.  Musculoskeletal: M/S 5/5 throughout.  Extremities without ischemic changes.  No deformity or atrophy. No edema. Neurologic: Sensation grossly intact in extremities.  Symmetrical.  Speech is fluent. Motor exam as listed above. Psychiatric: Judgment intact, Mood & affect appropriate for pt's clinical situation. Dermatologic: No rashes or ulcers noted.  No cellulitis or open wounds. Lymph : No Cervical, Axillary, or Inguinal lymphadenopathy.  CBC Lab Results  Component Value Date   WBC 8.4 04/03/2021   HGB 9.9 (L) 04/03/2021   HCT 29.8 (L) 04/03/2021   MCV 92.5 04/03/2021   PLT 188 04/03/2021   BMET    Component Value Date/Time   NA 137 04/03/2021 0519   K 3.8 04/03/2021  0519   CL 102 04/03/2021 0519   CO2 27 04/03/2021 0519   GLUCOSE 83 04/03/2021 0519   BUN 11 04/03/2021 0519   CREATININE 0.69 04/03/2021 0519   CALCIUM 8.5 (L) 04/03/2021 0519   GFRNONAA >60 04/03/2021 0519   GFRAA >60 07/11/2020 1245   Estimated Creatinine Clearance: 31.7 mL/min (by C-G formula based on SCr of 0.69 mg/dL).  COAG Lab Results  Component Value Date   INR 1.1 04/02/2021   INR 0.95 11/27/2018   Radiology DG Ankle Complete Right  Result Date: 04/02/2021 CLINICAL DATA:  Soft tissue swelling and redness EXAM: RIGHT ANKLE - COMPLETE 3+ VIEW COMPARISON:  None. FINDINGS: Frontal, oblique, and lateral views were obtained. There is soft tissue swelling. No evident fracture or joint effusion. No joint space narrowing or erosion. There is a small posterior calcaneal spur. Ankle mortise appears intact. No bony destruction. IMPRESSION: Soft tissue swelling. No fracture or appreciable arthropathy. Bony structures intact. Small posterior calcaneal spur. Electronically Signed   By: Bretta Bang III M.D.   On: 04/02/2021 08:51   CT Angio Chest PE W and/or Wo Contrast  Result Date: 04/02/2021 CLINICAL DATA:  RIGHT leg redness and swelling, dementia, high clinical suspicion of pulmonary embolism EXAM: CT ANGIOGRAPHY CHEST WITH CONTRAST TECHNIQUE: Multidetector CT imaging of the chest was performed using the standard protocol during bolus administration of intravenous contrast. Multiplanar CT image reconstructions and MIPs were obtained to evaluate the vascular anatomy. CONTRAST:  17mL OMNIPAQUE IOHEXOL 350 MG/ML SOLN IV COMPARISON:  None FINDINGS: Cardiovascular: Atherosclerotic calcifications aorta, coronary arteries and proximal great vessels. Aorta normal caliber without aneurysm or dissection. Thrombus identified within descending thoracic aorta. Pulmonary arteries well opacified. Single small pulmonary embolus identified within a subsegmental RIGHT lower lobe pulmonary artery. Remaining  pulmonary arteries patent. Central pulmonary arteries are borderline dilated. Minimal pericardial fluid. Cardiac chambers unremarkable. Mediastinum/Nodes: Large hiatal hernia. Esophagus unremarkable. Few normal size mediastinal lymph nodes. No thoracic adenopathy. Lungs/Pleura: Minimal subsegmental atelectasis at lung bases. Lungs otherwise clear. No pulmonary infiltrate, pleural effusion, or pneumothorax. Upper Abdomen: VP shunt tubing traverses anterior RIGHT chest wall into abdomen. Gallbladder  surgically absent. Visualized upper abdomen otherwise unremarkable Musculoskeletal: Osseous demineralization. Review of the MIP images confirms the above findings. IMPRESSION: Single small pulmonary embolus in a subsegmental RIGHT lower lobe pulmonary artery. Minimal subsegmental atelectasis at lung bases. Large hiatal hernia. Aortic Atherosclerosis (ICD10-I70.0). Findings called to Dr. Katrinka BlazingSmith on 04/02/2021 at 1018 hours. Electronically Signed   By: Ulyses SouthwardMark  Boles M.D.   On: 04/02/2021 10:18   US Venous Img Lower Unilateral Right  Result Date: 04/02/2021 CLINICAL DATA:  87 year old female with right lower extremity swelling. EXAM: RIGHT LOWER EXTREMITY VENOUS DOPPLER ULTRASOUND TECHNIQUE: Gray-scale sonography with graded compression, as well as color Doppler and duplex ultrasound were performed to evaluate the right lower extremity deep venous systems from the level of the common femoral vein and including the common femoral, femoral, profunda femoral, popliteal and calf veins including the posterior tibial, peroneal and gastrocnemius veins when visible. Spectral Doppler was utilized to evaluate flow at rest and with distal augmentation maneuvers in the common femoral, femoral and popliteal veins. The contralateral common femoral vein was also evaluated for comparison. COMPARISON:  None. FINDINGS: RIGHT LOWER EXTREMITY Common Femoral Vein: Acute, expansile, occlusive thrombus. Central Greater Saphenous Vein: Acute,  expansile, occlusive thrombus. Central Profunda Femoral Vein: Acute, expansile, occlusive thrombus. Femoral Vein: Acute, expansile, occlusive thrombus. Popliteal Vein: Acute, expansile, mostly occlusive thrombus. Calf Veins: Acute, expansile, occlusive thrombus. Other Findings:  Subcutaneous edema about the calf. LEFT LOWER EXTREMITY Common Femoral Vein: No evidence of thrombus. Normal compressibility, respiratory phasicity and response to augmentation. IMPRESSION: Acute deep vein thrombosis extending from the calf veins to the common femoral vein. The central aspect of thrombus is not identified. Marliss Cootsylan Suttle, MD [facility] Electronically Signed   By: Marliss Cootsylan  Suttle MD   On: 04/02/2021 09:29   Assessment/Plan Anne KirkMarjorie Harrison is a 87 y.o. female with medical history significant of hypertension, emphysema, dementia, uterine incontinence, who presents with right leg swelling found to have a extensive right lower extremity DVT and pulmonary embolism.  1.  Extensive right lower extremity DVT: Patient presents with a unilateral swelling to the right lower extremity.  Venous duplex was notable for acute deep vein thrombosis extending from the calf veins to the common femoral vein.  On exam, there is no acute vascular compromise warranting emergent intervention however the patient would benefit from a right lower extremity venous thrombectomy and thrombolysis and attempt to lessen the clot burden, reduce the risk of worsening PAD and decrease his postphlebitic symptoms.  Procedure, risks and benefits were explained to the patient and her daughter was at the bedside.  All questions were answered.  The patient and her daughter wish to proceed.  We will plan on this today.  2.  Pulmonary embolism: CTA was notable for single small pulmonary embolus in the segmental right lower lobe. Patient is asymptomatic denies any shortness of breath or chest pain.   Satting upper 90s on room air. Due to the small size and lack of symptoms do not feel there is any indication for pulmonary intervention at this time  3.  Need for anticoagulation: Agree with the initiation of heparin Will need to transition to oral anticoagulation most likely Eliquis for at least a year with her diagnosis of pulmonary embolism Daughter expresses her understanding  Discussed with Dr. Romie JumperSchnier  Zimal Weisensel A Daemian Gahm, PA-C  04/03/2021 9:16 AM  This note was created with Dragon medical transcription system.  Any error is purely unintentional

## 2021-04-03 NOTE — Progress Notes (Signed)
PROGRESS NOTE    Anne [facility]  LDJ:[id number] DOB: 02-Sep-1924 DOA: 04/02/2021 PCP: Almetta Lovely, [facility]   Brief Narrative: 87 year old Past Medical History Significant for Hypertension, Emphysema, dementia, who presents with right leg swelling.  Patient was noted to have right leg swelling and redness by the staff at the skilled nursing facility the morning of admission.  Patient report her mild pain in the right leg.  She reports mild intermittent shortness of breath.  She denies chest pain.  Patient was recently diagnosed with COVID 19 April 09/2021.  Evaluation in the ED COVID PCR was positive, white blood cell 9.1, x-ray of left ankle is negative for bony fracture.  Lower extremity Doppler showed extensive right leg DVT.  CTA shows a small subsegmental right lower lobe PE.  Patient was a started on heparin drip and vascular Dr. Edrick Kins  was consulted.   Assessment & Plan:   Principal Problem:   Pulmonary embolism (HCC) Active Problems:   Essential hypertension   DVT (deep venous thrombosis) (HCC)_right leg   Other emphysema (HCC)   COVID-19 virus infection   1-Right LE DVT; from calf vein to femoral Veins:  -Doppler: Acute deep vein thrombosis extending from the calf veins to the common femoral vein. The central aspect of thrombus is not identified. -Continue with Heparin Gtt.  -vascular was consulted, they discussed with daughter case,plan for thrombectomy today.  -Patient will need anticoagulation for at least one year.   2-Pulmonary Embolism;  CTA; small subsegmental right lower lobe.  Continue with Heparin gtt.   3-HTN; Continue with amlodipine, metoprolol.   4-Emphysema; Albuterol PRN.  Anemia; check anemia panel.   Covid PCR positive; likely old infection.  CT value was 39.8 outside of infectious range.        Estimated body mass index is 23.13 kg/m as calculated from the following:   Height as of this encounter: 4\' 11"  (1.499 m).   Weight as of  this encounter: 51.9 kg.   DVT prophylaxis: Heparin Gtt Code Status: DNR Family Communication: Daughter over phone was updated.  Disposition Plan:  Status is: Inpatient  Remains inpatient appropriate because:IV treatments appropriate due to intensity of illness or inability to take PO   Dispo: The patient is from: SNF              Anticipated d/c is to: SNF              Patient currently is not medically stable to d/c.   Difficult to place patient No        Consultants:   Vascular.   Procedures:  Doppler; Acute deep vein thrombosis extending from the calf veins to the common femoral vein. The central aspect of thrombus is not  identified.  Antimicrobials:    Subjective: Patient seen this morning. She is feeling well., denies chest pain or dyspnea.    Objective: Vitals:   04/03/21 0435 04/03/21 0933 04/03/21 1256 04/03/21 1432  BP: (!) 108/49 (!) 124/111 127/64 (!) 151/84  Pulse: (!) 59 68 65 77  Resp: 19 16 16 18   Temp: 98.3 F (36.8 C) 97.8 F (36.6 C) 97.8 F (36.6 C) 97.8 F (36.6 C)  TempSrc:  Oral    SpO2: 91% 94% 93% 94%  Weight:    51.9 kg  Height:    4\' 11"  (1.499 m)    Intake/Output Summary (Last 24 hours) at 04/03/2021 1501 Last data filed at 04/03/2021 0300 Gross per 24 hour  Intake 317.53 ml  Output 600  ml  Net -282.47 ml   Filed Weights   04/02/21 0811 04/03/21 1432  Weight: 51.9 kg 51.9 kg    Examination:  General exam: Appears calm and comfortable  Respiratory system: Clear to auscultation. Respiratory effort normal. Cardiovascular system: S1 & S2 heard, RRR. No JVD, murmurs, rubs, gallops or clicks. No pedal edema. Gastrointestinal system: Abdomen is nondistended, soft and nontender. No organomegaly or masses felt. Normal bowel sounds heard. Central nervous system: Alert and oriented. No focal neurological deficits. Extremities: right leg with Edema and ecchymosis.    Data Reviewed: I have personally reviewed following labs  and imaging studies  CBC: Recent Labs  Lab 04/02/21 0814 04/03/21 0519  WBC 9.3 8.4  NEUTROABS 5.7  --   HGB 11.6* 9.9*  HCT 35.3* 29.8*  MCV 93.6 92.5  PLT 187 188   Basic Metabolic Panel: Recent Labs  Lab 04/02/21 0814 04/03/21 0519  NA 135 137  K 4.0 3.8  CL 98 102  CO2 26 27  GLUCOSE 103* 83  BUN 15 11  CREATININE 0.73 0.69  CALCIUM 9.1 8.5*   GFR: Estimated Creatinine Clearance: 31.7 mL/min (by C-G formula based on SCr of 0.69 mg/dL). Liver Function Tests: No results for input(s): AST, ALT, ALKPHOS, BILITOT, PROT, ALBUMIN in the last 168 hours. No results for input(s): LIPASE, AMYLASE in the last 168 hours. No results for input(s): AMMONIA in the last 168 hours. Coagulation Profile: Recent Labs  Lab 04/02/21 0814  INR 1.1   Cardiac Enzymes: No results for input(s): CKTOTAL, CKMB, CKMBINDEX, TROPONINI in the last 168 hours. BNP (last 3 results) No results for input(s): PROBNP in the last 8760 hours. HbA1C: No results for input(s): HGBA1C in the last 72 hours. CBG: No results for input(s): GLUCAP in the last 168 hours. Lipid Profile: No results for input(s): CHOL, HDL, LDLCALC, TRIG, CHOLHDL, LDLDIRECT in the last 72 hours. Thyroid Function Tests: No results for input(s): TSH, T4TOTAL, FREET4, T3FREE, THYROIDAB in the last 72 hours. Anemia Panel: No results for input(s): VITAMINB12, FOLATE, FERRITIN, TIBC, IRON, RETICCTPCT in the last 72 hours. Sepsis Labs: No results for input(s): PROCALCITON, LATICACIDVEN in the last 168 hours.  Recent Results (from the past 240 hour(s))  Resp Panel by RT-PCR (Flu A&B, Covid) Nasopharyngeal Swab     Status: Abnormal   Collection Time: 04/02/21 10:43 AM   Specimen: Nasopharyngeal Swab; Nasopharyngeal(NP) swabs in vial transport medium  Result Value Ref Range Status   SARS Coronavirus 2 by RT PCR POSITIVE (A) NEGATIVE Final    Comment: RESULT CALLED TO, READ BACK BY AND VERIFIED WITH: TIFFANIE ROBINSON 1158 04/02/2021  DLB (NOTE) SARS-CoV-2 target nucleic acids are DETECTED.  The SARS-CoV-2 RNA is generally detectable in upper respiratory specimens during the acute phase of infection. Positive results are indicative of the presence of the identified virus, but do not rule out bacterial infection or co-infection with other pathogens not detected by the test. Clinical correlation with patient history and other diagnostic information is necessary to determine patient infection status. The expected result is Negative.  Fact Sheet for Patients: BloggerCourse.com  Fact Sheet for Healthcare Providers: SeriousBroker.it  This test is not yet approved or cleared by the [place] FDA and  has been authorized for detection and/or diagnosis of SARS-CoV-2 by FDA under an Emergency Use Authorization (EUA).  This EUA will remain in effect (meaning this test can  be used) for the duration of  the COVID-19 declaration under Section 564(b)(1) of the Act, 21 U.S.C.  section 360bbb-3(b)(1), unless the authorization is terminated or revoked sooner.     Influenza A by PCR NEGATIVE NEGATIVE Final   Influenza B by PCR NEGATIVE NEGATIVE Final    Comment: (NOTE) The Xpert Xpress SARS-CoV-2/FLU/RSV plus assay is intended as an aid in the diagnosis of influenza from Nasopharyngeal swab specimens and should not be used as a sole basis for treatment. Nasal washings and aspirates are unacceptable for Xpert Xpress SARS-CoV-2/FLU/RSV testing.  Fact Sheet for Patients: BloggerCourse.com  Fact Sheet for Healthcare Providers: SeriousBroker.it  This test is not yet approved or cleared by the [place] FDA and has been authorized for detection and/or diagnosis of SARS-CoV-2 by FDA under an Emergency Use Authorization (EUA). This EUA will remain in effect (meaning this test can be used) for the duration of the COVID-19  declaration under Section 564(b)(1) of the Act, 21 U.S.C. section 360bbb-3(b)(1), unless the authorization is terminated or revoked.  Performed at [facility], [address] [place], [place] [address]   MRSA PCR Screening     Status: None   Collection Time: 04/02/21  3:05 PM   Specimen: Nasopharyngeal  Result Value Ref Range Status   MRSA by PCR NEGATIVE NEGATIVE Final    Comment:        The GeneXpert MRSA Assay (FDA approved for NASAL specimens only), is one component of a comprehensive MRSA colonization surveillance program. It is not intended to diagnose MRSA infection nor to guide or monitor treatment for MRSA infections. Performed at [facility], [address], [place], [place] [address]          Radiology Studies: DG Ankle Complete Right  Result Date: 04/02/2021 CLINICAL DATA:  Soft tissue swelling and redness EXAM: RIGHT ANKLE - COMPLETE 3+ VIEW COMPARISON:  None. FINDINGS: Frontal, oblique, and lateral views were obtained. There is soft tissue swelling. No evident fracture or joint effusion. No joint space narrowing or erosion. There is a small posterior calcaneal spur. Ankle mortise appears intact. No bony destruction. IMPRESSION: Soft tissue swelling. No fracture or appreciable arthropathy. Bony structures intact. Small posterior calcaneal spur. Electronically Signed   By: Bretta Bang III M.D.   On: 04/02/2021 08:51   CT Angio Chest PE W and/or Wo Contrast  Result Date: 04/02/2021 CLINICAL DATA:  RIGHT leg redness and swelling, dementia, high clinical suspicion of pulmonary embolism EXAM: CT ANGIOGRAPHY CHEST WITH CONTRAST TECHNIQUE: Multidetector CT imaging of the chest was performed using the standard protocol during bolus administration of intravenous contrast. Multiplanar CT image reconstructions and MIPs were obtained to evaluate the vascular anatomy. CONTRAST:  32mL OMNIPAQUE IOHEXOL 350 MG/ML SOLN IV COMPARISON:  None FINDINGS:  Cardiovascular: Atherosclerotic calcifications aorta, coronary arteries and proximal great vessels. Aorta normal caliber without aneurysm or dissection. Thrombus identified within descending thoracic aorta. Pulmonary arteries well opacified. Single small pulmonary embolus identified within a subsegmental RIGHT lower lobe pulmonary artery. Remaining pulmonary arteries patent. Central pulmonary arteries are borderline dilated. Minimal pericardial fluid. Cardiac chambers unremarkable. Mediastinum/Nodes: Large hiatal hernia. Esophagus unremarkable. Few normal size mediastinal lymph nodes. No thoracic adenopathy. Lungs/Pleura: Minimal subsegmental atelectasis at lung bases. Lungs otherwise clear. No pulmonary infiltrate, pleural effusion, or pneumothorax. Upper Abdomen: VP shunt tubing traverses anterior RIGHT chest wall into abdomen. Gallbladder surgically absent. Visualized upper abdomen otherwise unremarkable Musculoskeletal: Osseous demineralization. Review of the MIP images confirms the above findings. IMPRESSION: Single small pulmonary embolus in a subsegmental RIGHT lower lobe pulmonary artery. Minimal subsegmental atelectasis at lung bases. Large hiatal hernia. Aortic  Atherosclerosis (ICD10-I70.0). Findings called to Dr. Katrinka BlazingSmith on 04/02/2021 at 1018 hours. Electronically Signed   By: Ulyses SouthwardMark  Boles M.D.   On: 04/02/2021 10:18   US Venous Img Lower Unilateral Right  Result Date: 04/02/2021 CLINICAL DATA:  87 year old female with right lower extremity swelling. EXAM: RIGHT LOWER EXTREMITY VENOUS DOPPLER ULTRASOUND TECHNIQUE: Gray-scale sonography with graded compression, as well as color Doppler and duplex ultrasound were performed to evaluate the right lower extremity deep venous systems from the level of the common femoral vein and including the common femoral, femoral, profunda femoral, popliteal and calf veins including the posterior tibial, peroneal and gastrocnemius veins when visible. Spectral Doppler was  utilized to evaluate flow at rest and with distal augmentation maneuvers in the common femoral, femoral and popliteal veins. The contralateral common femoral vein was also evaluated for comparison. COMPARISON:  None. FINDINGS: RIGHT LOWER EXTREMITY Common Femoral Vein: Acute, expansile, occlusive thrombus. Central Greater Saphenous Vein: Acute, expansile, occlusive thrombus. Central Profunda Femoral Vein: Acute, expansile, occlusive thrombus. Femoral Vein: Acute, expansile, occlusive thrombus. Popliteal Vein: Acute, expansile, mostly occlusive thrombus. Calf Veins: Acute, expansile, occlusive thrombus. Other Findings:  Subcutaneous edema about the calf. LEFT LOWER EXTREMITY Common Femoral Vein: No evidence of thrombus. Normal compressibility, respiratory phasicity and response to augmentation. IMPRESSION: Acute deep vein thrombosis extending from the calf veins to the common femoral vein. The central aspect of thrombus is not identified. Marliss Cootsylan Suttle, MD [facility] Electronically Signed   By: Marliss Cootsylan  Suttle MD   On: 04/02/2021 09:29        Scheduled Meds: . [MAR Hold] amLODipine  5 mg Oral Daily  . [MAR Hold] ascorbic acid  1,000 mg Oral Daily  . [MAR Hold] calcium-vitamin D  1 tablet Oral Q breakfast  . [MAR Hold] metoprolol succinate  25 mg Oral QHS  . pneumococcal 23 valent vaccine  0.5 mL Intramuscular Tomorrow-1000   Continuous Infusions: . sodium chloride 75 mL/hr at 04/03/21 0108  . sodium chloride 75 mL/hr at 04/03/21 1343  . ceFAZolin    . heparin 1,100 Units/hr (04/03/21 0628)     LOS: 0 days    Time spent: 35 minutes    Jacquline Terrill A Catori Panozzo, MD [facility]   If 7PM-7AM, please contact night-coverage [email or web address]  04/03/2021, 3:01 PM

## 2021-04-03 NOTE — Consult Note (Signed)
ANTICOAGULATION CONSULT NOTE  Pharmacy Consult for Heparin  Indication: pulmonary embolus and DVT  Allergies  Allergen Reactions  . Ciprofloxacin Hives    Patient Measurements: Height: 4\' 11"  (149.9 cm) Weight: 51.9 kg (114 lb 8 oz) IBW/kg (Calculated) : 43.2 Heparin Dosing Weight: 51.9 kg  Vital Signs: Temp: 97.8 F (36.6 C) (06/07 1432) Temp Source: Oral (06/07 0933) BP: 151/84 (06/07 1432) Pulse Rate: 77 (06/07 1432)  Labs: Recent Labs    04/02/21 0814 04/02/21 1900 04/03/21 0519 04/03/21 1355  HGB 11.6*  --  9.9*  --   HCT 35.3*  --  29.8*  --   PLT 187  --  188  --   APTT 53*  --   --   --   LABPROT 14.3  --   --   --   INR 1.1  --   --   --   HEPARINUNFRC  --  0.16* 0.23* 0.32  CREATININE 0.73  --  0.69  --     Estimated Creatinine Clearance: 31.7 mL/min (by C-G formula based on SCr of 0.69 mg/dL).   Medical History: Past Medical History:  Diagnosis Date  . Bronchitis, not specified as acute or chronic   . Essential (primary) hypertension   . Other emphysema (HCC)   . Tinea unguium   . Urge incontinence     Medications:  Medications Prior to Admission  Medication Sig Dispense Refill Last Dose  . acetaminophen (TYLENOL) 325 MG tablet Take 650 mg by mouth 3 (three) times daily.   04/01/2021 at 2100  . acetaminophen (TYLENOL) 325 MG tablet Take 325 mg by mouth every 4 (four) hours as needed for mild pain.   Unknown at PRN  . albuterol (VENTOLIN HFA) 108 (90 Base) MCG/ACT inhaler Inhale 1 puff into the lungs 2 (two) times daily.   04/01/2021 at 2100  . amLODipine (NORVASC) 5 MG tablet Take 5 mg by mouth daily.    04/01/2021 at 0900  . ascorbic acid (VITAMIN C) 500 MG tablet Take 1,000 mg by mouth daily.     . benzocaine (ORAJEL) 10 % mucosal gel Use as directed 1 application in the mouth or throat every 4 (four) hours as needed for mouth pain.   Unknown at PRN  . Calcium Carb-Cholecalciferol (OYSTER SHELL CALCIUM) 500-400 MG-UNIT TABS Take 1 tablet by mouth  daily.   04/01/2021 at 0900  . metoprolol succinate (TOPROL-XL) 25 MG 24 hr tablet Take 25 mg by mouth at bedtime.    04/01/2021 at 2100   Scheduled:  . [MAR Hold] amLODipine  5 mg Oral Daily  . [MAR Hold] ascorbic acid  1,000 mg Oral Daily  . [MAR Hold] calcium-vitamin D  1 tablet Oral Q breakfast  . fentaNYL      . [MAR Hold] metoprolol succinate  25 mg Oral QHS  . midazolam      . pneumococcal 23 valent vaccine  0.5 mL Intramuscular Tomorrow-1000  . ceFAZolin (ANCEF) IVPB 1 gram/100 mL NS (Mini-Bag Plus)       Infusions:  . sodium chloride 75 mL/hr at 04/03/21 0108  . sodium chloride 75 mL/hr at 04/03/21 1343  . heparin 1,100 Units/hr (04/03/21 0628)   PRN: 06/03/21 Hold] acetaminophen, [MAR Hold] albuterol, [MAR Hold] benzocaine, [MAR Hold] dextromethorphan-guaiFENesin, diphenhydrAMINE, famotidine, [MAR Hold] hydrALAZINE, [MAR Hold]  HYDROmorphone (DILAUDID) injection, methylPREDNISolone (SOLU-MEDROL) injection, midazolam, midazolam, [MAR Hold] ondansetron (ZOFRAN) IV, [MAR Hold] ondansetron (ZOFRAN) IV Anti-infectives (From admission, onward)   Start  Dose/Rate Route Frequency Ordered Stop   04/03/21 1510  sodium chloride 0.9 % with ceFAZolin (ANCEF) ADS Med       Note to Pharmacy: Rita Ohara   : cabinet override      04/03/21 1510 04/04/21 0314   04/03/21 1444  ceFAZolin (ANCEF) 2-4 GM/100ML-% IVPB       Note to Pharmacy: Rita Ohara   : cabinet override      04/03/21 1444 04/03/21 1516      Assessment: Pharmacy consulted to start heparin for PE and DVT. No DOAC PTA. CBC stable. Chest CT: Single small pulmonary embolus in a subsegmental RIGHT lower lobe pulmonary artery. RLE doppler shows acute DVT extending from the calf veins to the common femoral vein. Plan for thrombectomy on 6/7.   6/6 1900 HL = 0.16, subtherapeutic @ 800 units/hr 6/7 0519 HL = 0.23, subtherapeutic @ 1000 units/hr 6/7 1355 HL = 0.32, therapeutic @ 1100 units/hr  Goal of Therapy:  Heparin level  0.3-0.7 units/ml Monitor platelets by anticoagulation protocol: Yes   Plan:  6/7:  HL @ 1355 = 0.32, therapeutic  Continue heparin infusion at 1100 units/hr Monitor daily CBC, s/s of bleed Will recheck HL 8 hrs after rate change  Derrek Gu, PharmD 04/03/2021,3:30 PM

## 2021-04-03 NOTE — Op Note (Signed)
[facility]   OPERATIVE NOTE   PRE-OPERATIVE DIAGNOSIS: Extensive painful right lower extremity DVT  POST-OPERATIVE DIAGNOSIS: same   PROCEDURE: 1.   US guidance for vascular access to right popliteal vein 2.   Catheter placement into right common iliac vein from right popliteal approach 3.   IVC gram and right lower extremity venogram 4.   Catheter directed thrombolysis with 8 mg of tPA to the superficial femoral, common femoral external iliac and common iliac veins 5.   Mechanical thrombectomy of the right superficial femoral common femoral external iliac and common iliac veins 6.   PTA of right superficial femoral common femoral external iliac and common iliac veins with 8 mm x 100 mm Dorado balloon    SURGEON: Levora Dredge, MD  ASSISTANT(S): none  ANESTHESIA: local with moderate conscious sedation for 1 hour 18 minutes and 21 seconds.  Continuous ECG pulse oximetry and cardiopulmonary monitoring was performed throughout the entire procedure by the interventional radiology nurse.  Parenteral Versed and fentanyl was utilized.   ESTIMATED BLOOD LOSS: 100 cc  FINDING(S): 1.   Occlusive thrombus located in the superficial femoral vein common femoral vein external iliac vein as well as the common iliac vein on the right.  Inferior vena cava was widely patent  SPECIMEN(S):  none  INDICATIONS:    Patient is a 87 y.o. female who presents with extensive painful right lower extremity DVT.  Patient has marked leg swelling and pain.  Venous intervention is performed to reduce the symtpoms and avoid long term postphlebitic symptoms.    DESCRIPTION: After obtaining full informed written consent, the patient was brought back to the vascular suite and placed supine upon the table. Moderate conscious sedation was administered during a face to face encounter with the patient throughout the procedure with my supervision of the RN administering medicines and monitoring  the patient's vital signs, pulse oximetry, telemetry and mental status throughout from the start of the procedure until the patient was taken to the recovery room.    After obtaining adequate sedation, the patient was placed in the prone position and the right leg was prepped and draped in the standard fashion for a right lower extremity thrombectomy.     The right popliteal vein was then accessed under direct ultrasound guidance without difficulty with a micropuncture needle and a permanent image was recorded.  I then upsized to an 8Fr sheath over a J wire.   2000 units of heparin were then given.  Imaging showed extensive occlusive DVT with minimal flow.  A Kumpe catheter and glide wire were then advanced into the CFV and images were performed.   Occlusive thrombus was noted at this level and therefore the wire was introduced again and the catheter advanced up into the inferior vena cava.  I was able to cross the thrombus and stenosis and advance into the IVC which was found to be patent.  I then used the 50 cm long infusion length catheter and instilled 8 of tpa throughout the superficial femoral, common femoral, external iliac and common veins.  After this dwelled for 15 minutes, I used the Penumbra Cat 8 catheter with the lightening technology and evacuated about 150 of effluent with mechanical thrombectomy throughout the right superficial femoral, common femoral, external iliac and common veins.    Contrast injection through the 8 French sheath showed the thrombectomy had successfully recanalized the venous system of the right lower extremity.  However, within the proximal superficial femoral vein, the  entire length of the common femoral vein as well as the entire length of the external iliac vein and common iliac veins there remained greater than 70% residual stenosis.   I then treated the proximal SFV and CFV as well as the external iliac and common iliac veins with an 8 mm x 100 mm diameter Dorado  angioplasty balloon to fully open the right leg venous system.  This resulted in some resolution of the thrombus and improved flow throughout the right lower extremity venous system and I elected to terminate the case.   The sheath was removed and a dressing was placed.  She was taken to the recovery room in stable condition having tolerated the procedure well.    COMPLICATIONS: None  CONDITION: Stable  Levora Dredge 04/03/2021 4:26 PM

## 2021-04-03 NOTE — Interval H&P Note (Signed)
History and Physical Interval Note:  04/03/2021 2:53 PM  Anne Harrison  has presented today for surgery, with the diagnosis of DVT.  The various methods of treatment have been discussed with the patient and family. After consideration of risks, benefits and other options for treatment, the patient has consented to  Procedure(s): PERIPHERAL VASCULAR THROMBECTOMY (Right) as a surgical intervention.  The patient's history has been reviewed, patient examined, no change in status, stable for surgery.  I have reviewed the patient's chart and labs.  Questions were answered to the patient's satisfaction.     Levora Dredge

## 2021-04-03 NOTE — Consult Note (Signed)
ANTICOAGULATION CONSULT NOTE  Pharmacy Consult for Heparin  Indication: pulmonary embolus  Allergies  Allergen Reactions  . Ciprofloxacin Hives    Patient Measurements: Height: 4\' 11"  (149.9 cm) Weight: 51.9 kg (114 lb 8 oz) IBW/kg (Calculated) : 43.2 Heparin Dosing Weight: 51.9 kg  Vital Signs: Temp: 98.3 F (36.8 C) (06/07 0435) BP: 108/49 (06/07 0435) Pulse Rate: 59 (06/07 0435)  Labs: Recent Labs    04/02/21 0814 04/02/21 1900 04/03/21 0519  HGB 11.6*  --  9.9*  HCT 35.3*  --  29.8*  PLT 187  --  188  APTT 53*  --   --   LABPROT 14.3  --   --   INR 1.1  --   --   HEPARINUNFRC  --  0.16* 0.23*  CREATININE 0.73  --  0.69    Estimated Creatinine Clearance: 31.7 mL/min (by C-G formula based on SCr of 0.69 mg/dL).   Medical History: Past Medical History:  Diagnosis Date  . Bronchitis, not specified as acute or chronic   . Essential (primary) hypertension   . Other emphysema (HCC)   . Tinea unguium   . Urge incontinence     Medications:  Medications Prior to Admission  Medication Sig Dispense Refill Last Dose  . acetaminophen (TYLENOL) 325 MG tablet Take 650 mg by mouth 3 (three) times daily.   04/01/2021 at 2100  . acetaminophen (TYLENOL) 325 MG tablet Take 325 mg by mouth every 4 (four) hours as needed for mild pain.   Unknown at PRN  . albuterol (VENTOLIN HFA) 108 (90 Base) MCG/ACT inhaler Inhale 1 puff into the lungs 2 (two) times daily.   04/01/2021 at 2100  . amLODipine (NORVASC) 5 MG tablet Take 5 mg by mouth daily.    04/01/2021 at 0900  . ascorbic acid (VITAMIN C) 500 MG tablet Take 1,000 mg by mouth daily.     . benzocaine (ORAJEL) 10 % mucosal gel Use as directed 1 application in the mouth or throat every 4 (four) hours as needed for mouth pain.   Unknown at PRN  . Calcium Carb-Cholecalciferol (OYSTER SHELL CALCIUM) 500-400 MG-UNIT TABS Take 1 tablet by mouth daily.   04/01/2021 at 0900  . metoprolol succinate (TOPROL-XL) 25 MG 24 hr tablet Take 25 mg by  mouth at bedtime.    04/01/2021 at 2100   Scheduled:  . amLODipine  5 mg Oral Daily  . ascorbic acid  1,000 mg Oral Daily  . calcium-vitamin D  1 tablet Oral Q breakfast  . heparin  750 Units Intravenous Once  . metoprolol succinate  25 mg Oral QHS  . pneumococcal 23 valent vaccine  0.5 mL Intramuscular Tomorrow-1000   Infusions:  . sodium chloride 75 mL/hr at 04/03/21 0108  . heparin 1,000 Units/hr (04/02/21 2048)   PRN: acetaminophen, albuterol, benzocaine, dextromethorphan-guaiFENesin, hydrALAZINE, ondansetron (ZOFRAN) IV Anti-infectives (From admission, onward)   None      Assessment: Pharmacy consulted to start heparin for PE. No DOAC PTA. CBC stable. Chest CT: Single small pulmonary embolus in a subsegmental RIGHT lower lobe pulmonary artery.   6/6 1900 HL = 0.16, subtherapeutic @ 800 units/hr 6/7 0519 HL = 0.23, subtherapeutic @ 1000 units/hr  Goal of Therapy:  Heparin level 0.3-0.7 units/ml Monitor platelets by anticoagulation protocol: Yes   Plan:  6/7:  HL @ 0519 = 0.23, subtherapeutic  Will order heparin 750 units IV X 1 and increase drip rate to 1100 units/hr.  Will recheck HL 8 hrs after rate  change.   Adreena Willits D, PharmD 04/03/2021,6:18 AM

## 2021-04-04 ENCOUNTER — Encounter: Payer: Self-pay | Admitting: Vascular Surgery

## 2021-04-04 DIAGNOSIS — F0390 Unspecified dementia without behavioral disturbance: Secondary | ICD-10-CM

## 2021-04-04 DIAGNOSIS — Z8616 Personal history of COVID-19: Secondary | ICD-10-CM

## 2021-04-04 DIAGNOSIS — I82411 Acute embolism and thrombosis of right femoral vein: Principal | ICD-10-CM

## 2021-04-04 DIAGNOSIS — R5381 Other malaise: Secondary | ICD-10-CM

## 2021-04-04 LAB — FOLATE: Folate: 14.4 ng/mL (ref >5.9)

## 2021-04-04 LAB — HEPARIN LEVEL (UNFRACTIONATED)
Heparin Unfractionated: <0.1 IU/mL — ABNORMAL LOW (ref 0.30–0.70)
Heparin Unfractionated: 0.46 IU/mL (ref 0.30–0.70)
Heparin Unfractionated: 0.45 IU/mL (ref 0.30–0.70)

## 2021-04-04 LAB — IRON AND TIBC
Iron: 30 ug/dL (ref 28–170)
Saturation Ratios: 17 % (ref 10.4–31.8)
TIBC: 181 ug/dL — ABNORMAL LOW (ref 250–450)
UIBC: 151 ug/dL

## 2021-04-04 LAB — CBC
HCT: 31.3 % — ABNORMAL LOW (ref 36.0–46.0)
Hemoglobin: 10.2 g/dL — ABNORMAL LOW (ref 12.0–15.0)
MCH: 30.7 pg (ref 26.0–34.0)
MCHC: 32.6 g/dL (ref 30.0–36.0)
MCV: 94.3 fL (ref 80.0–100.0)
Platelets: 192 10*3/uL (ref 150–400)
RBC: 3.32 MIL/uL — ABNORMAL LOW (ref 3.87–5.11)
RDW: 14.3 % (ref 11.5–15.5)
WBC: 10.2 10*3/uL (ref 4.0–10.5)
nRBC: 0 % (ref 0.0–0.2)

## 2021-04-04 LAB — FERRITIN: Ferritin: 216 ng/mL (ref 11–307)

## 2021-04-04 LAB — RETICULOCYTES
Immature Retic Fract: 26.4 % — ABNORMAL HIGH (ref 2.3–15.9)
RBC.: 3.23 MIL/uL — ABNORMAL LOW (ref 3.87–5.11)
Retic Count, Absolute: 47.5 10*3/uL (ref 19.0–186.0)
Retic Ct Pct: 1.5 % (ref 0.4–3.1)

## 2021-04-04 LAB — VITAMIN B12: Vitamin B-12: 193 pg/mL (ref 180–914)

## 2021-04-04 MED ORDER — TRAMADOL HCL 50 MG PO TABS
50.0000 mg | ORAL_TABLET | Freq: Once | ORAL | Status: AC
  Administered 2021-04-04: 50 mg via ORAL
  Filled 2021-04-04: qty 1

## 2021-04-04 MED ORDER — HEPARIN BOLUS VIA INFUSION
1500.0000 [IU] | Freq: Once | INTRAVENOUS | Status: AC
  Administered 2021-04-04: 04:00:00 1500 [IU] via INTRAVENOUS
  Filled 2021-04-04: qty 1500

## 2021-04-04 NOTE — TOC Initial Note (Signed)
Transition of Care [facility]) - Initial/Assessment Note    Patient Details  Name: Anne Harrison MRN: [id number] Date of Birth: [birth year]  [facility] CM/SW Contact:    Shelbie Hutching, RN Phone Number: 04/04/2021, 4:24 PM  Clinical Narrative:                 Patient admitted for PE and DVT.  Patient went for a thrombectomy yesterday with vascular surgery.  Patient is still on heparin drip.  RNCM met with patient and patient's daughter at the bedside.  Patient is from [place].  Patient has been living there for 2 years and 4 months.  Patient is ambulatory with her rollator at the facility.  Patient is much weaker here in the hospital and PT is recommending skilled nursing for short term rehab.  Patient and patient's daughter agree to SNF and prefer [facility].  Bed search started.    Expected Discharge Plan: Skilled Nursing Facility Barriers to Discharge: Continued Medical Work up   Patient Goals and CMS Choice Patient states their goals for this hospitalization and ongoing recovery are:: daughter wants patient to get back to her baseline and be ambulator and go back to [employer]gov Compare Post Acute Care list provided to:: Patient Represenative (must comment) Choice offered to / list presented to : Adult Children  Expected Discharge Plan and Services Expected Discharge Plan: [place]   Discharge Planning Services: CM Consult Post Acute Care Choice: [place] Living arrangements for the past 2 months: [place]                 DME Arranged: N/A DME Agency: NA       HH Arranged: NA          Prior Living Arrangements/Services Living arrangements for the past 2 months: [place] Lives with:: Facility Resident Patient language and need for interpreter reviewed:: Yes Do you feel safe going back to the place where you live?: Yes      Need for Family Participation in Patient Care: Yes  (Comment) (dementia PE and DVT) Care giver support system in place?: Yes (comment) (daughter) Current home services: DME (rollator) Criminal Activity/Legal Involvement Pertinent to Current Situation/Hospitalization: No - Comment as needed  Activities of Daily Living Home Assistive Devices/Equipment: Environmental consultant (specify type) ADL Screening (condition at time of admission) Patient's cognitive ability adequate to safely complete daily activities?: Yes Is the patient deaf or have difficulty hearing?: Yes Does the patient have difficulty seeing, even when wearing glasses/contacts?: No Does the patient have difficulty concentrating, remembering, or making decisions?: No Patient able to express need for assistance with ADLs?: Yes Does the patient have difficulty dressing or bathing?: Yes Independently performs ADLs?: No Communication: Needs assistance Is this a change from baseline?: Pre-admission baseline Dressing (OT): Needs assistance Is this a change from baseline?: Pre-admission baseline Grooming: Needs assistance Is this a change from baseline?: Pre-admission baseline Bathing: Independent Toileting: Needs assistance Is this a change from baseline?: Pre-admission baseline In/Out Bed: Needs assistance Is this a change from baseline?: Pre-admission baseline Walks in Home: Independent with device (comment) Is this a change from baseline?: Pre-admission baseline Does the patient have difficulty walking or climbing stairs?: Yes Weakness of Legs: Both Weakness of Arms/Hands: Both  Permission Sought/Granted Permission sought to share information with : Case Manager,Family Chief Financial Officer Permission granted to share information with : Yes, Verbal Permission Granted  Share Information with NAME: Vickie  Permission granted  to share info w AGENCY: [place] granted to share info w Relationship: daughter     Emotional Assessment Appearance::  Appears stated age Attitude/Demeanor/Rapport: Engaged Affect (typically observed): Accepting Orientation: : Oriented to Self,Oriented to Place,Oriented to Situation Alcohol / Substance Use: Not Applicable Psych Involvement: No (comment)  Admission diagnosis:  Pulmonary embolism (HCC) [I26.99] DVT (deep vein thrombosis) in pregnancy [O22.30] Single subsegmental pulmonary embolism without acute cor pulmonale ([place]) [I26.93] Patient Active Problem List   Diagnosis Date Noted  . Pulmonary embolism ([place]) 04/02/2021  . DVT (deep venous thrombosis) (HCC)_right leg 04/02/2021  . COVID-19 virus infection 04/02/2021  . Other emphysema ([place])   . Onychomycosis 10/22/2017  . Essential hypertension 10/07/2017   PCP:  [facility]:   [place], [place] - [address] [place] [address] Phone: [phone] Fax: [place], [place] [address] [place] [address] Phone: [phone] Fax: [phone]     Social Determinants of Health (SDOH) Interventions    Readmission Risk Interventions No flowsheet data found.

## 2021-04-04 NOTE — Consult Note (Signed)
ANTICOAGULATION CONSULT NOTE  Pharmacy Consult for Heparin  Indication: pulmonary embolus and DVT  Allergies  Allergen Reactions  . Ciprofloxacin Hives    Patient Measurements: Height: 4\' 11"  (149.9 cm) Weight: 51.9 kg (114 lb 8 oz) IBW/kg (Calculated) : 43.2 Heparin Dosing Weight: 51.9 kg  Vital Signs: Temp: 98.2 F (36.8 C) (06/08 0853) Temp Source: Oral (06/08 0556) BP: 105/62 (06/08 1151) Pulse Rate: 79 (06/08 1151)  Labs: Recent Labs    04/02/21 0814 04/02/21 1900 04/03/21 0519 04/03/21 1355 04/04/21 0234 04/04/21 1231  HGB 11.6*  --  9.9*  --  10.2*  --   HCT 35.3*  --  29.8*  --  31.3*  --   PLT 187  --  188  --  192  --   APTT 53*  --   --   --   --   --   LABPROT 14.3  --   --   --   --   --   INR 1.1  --   --   --   --   --   HEPARINUNFRC  --    < > 0.23* 0.32 <0.10* 0.46  CREATININE 0.73  --  0.69  --   --   --    < > = values in this interval not displayed.    Estimated Creatinine Clearance: 31.7 mL/min (by C-G formula based on SCr of 0.69 mg/dL).   Medical History: Past Medical History:  Diagnosis Date  . Bronchitis, not specified as acute or chronic   . Essential (primary) hypertension   . Other emphysema (HCC)   . Tinea unguium   . Urge incontinence     Medications:  Medications Prior to Admission  Medication Sig Dispense Refill Last Dose  . acetaminophen (TYLENOL) 325 MG tablet Take 650 mg by mouth 3 (three) times daily.   04/01/2021 at 2100  . acetaminophen (TYLENOL) 325 MG tablet Take 325 mg by mouth every 4 (four) hours as needed for mild pain.   Unknown at PRN  . albuterol (VENTOLIN HFA) 108 (90 Base) MCG/ACT inhaler Inhale 1 puff into the lungs 2 (two) times daily.   04/01/2021 at 2100  . amLODipine (NORVASC) 5 MG tablet Take 5 mg by mouth daily.    04/01/2021 at 0900  . ascorbic acid (VITAMIN C) 500 MG tablet Take 1,000 mg by mouth daily.     . benzocaine (ORAJEL) 10 % mucosal gel Use as directed 1 application in the mouth or throat  every 4 (four) hours as needed for mouth pain.   Unknown at PRN  . Calcium Carb-Cholecalciferol (OYSTER SHELL CALCIUM) 500-400 MG-UNIT TABS Take 1 tablet by mouth daily.   04/01/2021 at 0900  . metoprolol succinate (TOPROL-XL) 25 MG 24 hr tablet Take 25 mg by mouth at bedtime.    04/01/2021 at 2100   Scheduled:  . amLODipine  5 mg Oral Daily  . ascorbic acid  1,000 mg Oral Daily  . calcium-vitamin D  1 tablet Oral Q breakfast  . metoprolol succinate  25 mg Oral QHS  . pneumococcal 23 valent vaccine  0.5 mL Intramuscular Tomorrow-1000   Infusions:  . sodium chloride 75 mL/hr at 04/04/21 1005  . heparin 1,300 Units/hr (04/04/21 0407)   PRN: acetaminophen, albuterol, benzocaine, dextromethorphan-guaiFENesin, hydrALAZINE, ondansetron (ZOFRAN) IV, ondansetron (ZOFRAN) IV Anti-infectives (From admission, onward)   Start     Dose/Rate Route Frequency Ordered Stop   04/03/21 1510  sodium chloride 0.9 % with ceFAZolin (ANCEF)  ADS Med       Note to Pharmacy: Rita Ohara   : cabinet override      04/03/21 1510 04/04/21 0314   04/03/21 1444  ceFAZolin (ANCEF) 2-4 GM/100ML-% IVPB       Note to Pharmacy: Rita Ohara   : cabinet override      04/03/21 1444 04/03/21 1516      Assessment: Pharmacy consulted to start heparin for PE and DVT. No DOAC PTA. CBC stable. Chest CT: Single small pulmonary embolus in a subsegmental RIGHT lower lobe pulmonary artery. RLE doppler shows acute DVT extending from the calf veins to the common femoral vein. S/p thrombectomy on 6/7.  Hgb: 10.2 and plts: 192  6/6 1900 HL = 0.16, subtherapeutic @ 800 units/hr 6/7 0519 HL = 0.23, subtherapeutic @ 1000 units/hr 6/7 1355 HL = 0.32, therapeutic @ 1100 units/hr 6/8 0234 HL = < 0.1, subtherapeutic  6/8 1231 HL = 0.46, therapeutic  Goal of Therapy:  Heparin level 0.3-0.7 units/ml Monitor platelets by anticoagulation protocol: Yes   Plan:  6/8: HL @ 1231 = 0.46, therapeutic Continue heparin infusion at 1300  units/hr  Will recheck HL in 8 hrs  Monitor daily HL, CBC, s/s of bleed    Derrek Gu, PharmD 04/04/2021,1:00 PM

## 2021-04-04 NOTE — Consult Note (Signed)
[facility] Daily Progress Note  04/03/21: 1. US guidance for vascular access to right popliteal vein 2. Catheter placement into right common iliac vein from right popliteal approach 3. IVC gram and right lower extremity venogram 4.   Catheter directed thrombolysis with 8 mg of tPA to the superficial femoral, common femoral external iliac and common iliac veins 5. Mechanical thrombectomy of the right superficial femoral common femoral external iliac and common iliac veins 6. PTA of right superficial femoral common femoral external iliac and common iliac veins with 8 mm x 100 mm [place] balloon  Subjective: Daughter at bedside. Patient resting comfortably sitting in chair.  Patient is without complaint. No issues overnight.  Objective: Vitals:   04/04/21 0101 04/04/21 0556 04/04/21 0853 04/04/21 1151  BP: (!) 143/60 119/63 122/61 105/62  Pulse: 69 70 75 79  Resp: 16  17   Temp: (!) 97.3 F (36.3 C) 97.8 F (36.6 C) 98.2 F (36.8 C)   TempSrc: Oral Oral    SpO2: 92% 94% 92% 96%  Weight:      Height:        Intake/Output Summary (Last 24 hours) at 04/04/2021 1339 Last data filed at 04/04/2021 1030 Gross per 24 hour  Intake 60 ml  Output 500 ml  Net -440 ml   Physical Exam: A&Ox1, NAD CV: RRR Pulmonary: CTA Bilaterally Abdomen: Soft, Nontender, Nondistended Access Site:  Clean and dry. No swelling or drainage Vascular:  Right lower extremity: Thigh soft. Calf soft. There is improvement in the patient's edema however there is still moderate edema.  Extremity is soft.  Motor/sensory is intact.  Hard to palpate pedal pulses due to edema however there is a good capillary refill   Laboratory: CBC    Component Value Date/Time   WBC 10.2 04/04/2021 0234   HGB 10.2 (L) 04/04/2021 0234   HCT 31.3 (L) 04/04/2021 0234   PLT 192 04/04/2021 0234   BMET    Component Value Date/Time   NA 137 04/03/2021 0519   K 3.8 04/03/2021 0519   CL 102 04/03/2021  0519   CO2 27 04/03/2021 0519   GLUCOSE 83 04/03/2021 0519   BUN 11 04/03/2021 0519   CREATININE 0.69 04/03/2021 0519   CALCIUM 8.5 (L) 04/03/2021 0519   GFRNONAA >60 04/03/2021 0519   GFRAA >60 07/11/2020 1245   Assessment/Planning: The patient is a 87 year old female who presented with extensive right lower extremity DVT and pulmonary embolism  1) right lower extremity DVT: Patient's DVT was extensive she is now status post right lower extremity thrombolysis/thrombectomy.  The patient's extremity still edematous however it does take a few days to a week for the edema to improve.  Encourage elevation.  Patient denies any right lower extremity pain. 2) pulmonary embolism: Patient is still asymptomatic. Satting normal on room air. 3) okay to transition to oral anticoagulation.  Recommend Eliquis with loading dose. 4) patient can be discharged home from a vascular standpoint when medically stable.  Discussed with Dr. Charlie Pitter Damyia Strider PA-C 04/04/2021 1:39 PM

## 2021-04-04 NOTE — Progress Notes (Signed)
Patient with her RLE wrapped from thigh to toes with coban post thrombectomy yesterday.  Coban has slid down her leg to just above her knee.  Coban removed per vascular and dressing is intact with no s/s of bleeding.  Pt with palpable DP pulses bilateral post removal.  Will continue to monitor.

## 2021-04-04 NOTE — Progress Notes (Signed)
PROGRESS NOTE  Anne Harrison MGQ:[id number] DOB: Jul 24, 1924   PCP: [facility]  Patient is from: ALF  DOA: 04/02/2021 LOS: 1  Chief complaints: Right leg swelling and shortness of breath  Brief Narrative / Interim history: 87 year old F with PMH of HTN, emphysema, dementia and recent COVID-19 infection in 01/2021 presenting with RLE swelling and shortness of breath and found to have RLL PE and extensive RLE DVT.  She was started on IV heparin.  She underwent thrombectomy with thrombolysis of RLE DVT and PTA from right superficial femoral to common iliac veins by vascular surgery on 6/7.   Subjective: Seen and examined earlier this morning.  No major events overnight of this morning.  No complaints but not a reliable historian.  She is oriented to self and "hospital".  She responds no to chest pain, shortness of breath or pain elsewhere.  Objective: Vitals:   04/04/21 0101 04/04/21 0556 04/04/21 0853 04/04/21 1151  BP: (!) 143/60 119/63 122/61 105/62  Pulse: 69 70 75 79  Resp: 16  17   Temp: (!) 97.3 F (36.3 C) 97.8 F (36.6 C) 98.2 F (36.8 C)   TempSrc: Oral Oral    SpO2: 92% 94% 92% 96%  Weight:      Height:        Intake/Output Summary (Last 24 hours) at 04/04/2021 1501 Last data filed at 04/04/2021 1417 Gross per 24 hour  Intake 60 ml  Output 500 ml  Net -440 ml   Filed Weights   04/02/21 0811 04/03/21 1432  Weight: 51.9 kg 51.9 kg    Examination:  GENERAL: No apparent distress.  Nontoxic. HEENT: MMM.  Vision and hearing grossly intact.  NECK: Supple.  No apparent JVD.  RESP: On RA.  No IWOB.  Fair aeration bilaterally. CVS:  RRR. Heart sounds normal.  ABD/GI/GU: BS+. Abd soft, NTND.  MSK/EXT:  Moves extremities.  Ace wrap over RLE SKIN: no apparent skin lesion or wound NEURO: Awake and alert.  Oriented to self and "hospital".  No apparent focal neuro deficit. PSYCH: Calm. Normal affect.   Procedures:  6/7-thrombectomy with thrombolysis of  RLE DVT and PTA from right superficial femoral to common iliac veins  Microbiology summarized: COVID-19 PCR positive Influenza PCR negative MRSA PCR nonreactive  Assessment & Plan: Extensive RLE DVT: from calf vein to femoral Veins Subsegmental RLL PE without heart strain -S/p thrombectomy and thrombolysis by Dr. Gilda Crease on 6/7 -On IV heparin.  Will transition to p.o. anticoagulation before discharge  Essential hypertension: Normotensive. -Continue with amlodipine, metoprolol.   Emphysema: No respiratory distress.  On RA. -Albuterol PRN.   Normocytic anemia: H&H stable. Recent Labs    07/11/20 1245 02/15/21 2212 04/02/21 0814 04/03/21 0519 04/04/21 0234  HGB 12.4 12.9 11.6* 9.9* 10.2*   Dementia without behavioral disturbance: Oriented to self and "hospital".  No insight to why she is in the hospital. -Reorientation and delirium precautions  History of COVID-19 infection: Was positive in April 2022.  COVID-19 screen on admission reactive -No indication for contact or airborne precautions.  Debility/physical deconditioning -PT/OT recommended SNF.  Body mass index is 23.13 kg/m.         DVT prophylaxis:  On IV heparin for VTE  Code Status: DNR/DNI Family Communication: Patient and/or RN. Available if any question.  Level of care: Med-Surg Status is: Inpatient  Remains inpatient appropriate because:Unsafe d/c plan, IV treatments appropriate due to intensity of illness or inability to take PO and Inpatient level of care appropriate due  to severity of illness   Dispo: The patient is from: ALF              Anticipated d/c is to: SNF              Patient currently is not medically stable to d/c.   Difficult to place patient No       Consultants:  Vascular surgery   Sch Meds:  Scheduled Meds: . amLODipine  5 mg Oral Daily  . ascorbic acid  1,000 mg Oral Daily  . calcium-vitamin D  1 tablet Oral Q breakfast  . metoprolol succinate  25 mg Oral QHS    Continuous Infusions: . heparin 1,300 Units/hr (04/04/21 0407)   PRN Meds:.acetaminophen, albuterol, benzocaine, dextromethorphan-guaiFENesin, hydrALAZINE, ondansetron (ZOFRAN) IV, ondansetron (ZOFRAN) IV  Antimicrobials: Anti-infectives (From admission, onward)   Start     Dose/Rate Route Frequency Ordered Stop   04/03/21 1510  sodium chloride 0.9 % with ceFAZolin (ANCEF) ADS Med       Note to Pharmacy: Rita Ohara   : cabinet override      04/03/21 1510 04/04/21 0314   04/03/21 1444  ceFAZolin (ANCEF) 2-4 GM/100ML-% IVPB       Note to Pharmacy: Rita Ohara   : cabinet override      04/03/21 1444 04/03/21 1516       I have personally reviewed the following labs and images: CBC: Recent Labs  Lab 04/02/21 0814 04/03/21 0519 04/04/21 0234  WBC 9.3 8.4 10.2  NEUTROABS 5.7  --   --   HGB 11.6* 9.9* 10.2*  HCT 35.3* 29.8* 31.3*  MCV 93.6 92.5 94.3  PLT 187 188 192   BMP &GFR Recent Labs  Lab 04/02/21 0814 04/03/21 0519  NA 135 137  K 4.0 3.8  CL 98 102  CO2 26 27  GLUCOSE 103* 83  BUN 15 11  CREATININE 0.73 0.69  CALCIUM 9.1 8.5*   Estimated Creatinine Clearance: 31.7 mL/min (by C-G formula based on SCr of 0.69 mg/dL). Liver & Pancreas: No results for input(s): AST, ALT, ALKPHOS, BILITOT, PROT, ALBUMIN in the last 168 hours. No results for input(s): LIPASE, AMYLASE in the last 168 hours. No results for input(s): AMMONIA in the last 168 hours. Diabetic: No results for input(s): HGBA1C in the last 72 hours. No results for input(s): GLUCAP in the last 168 hours. Cardiac Enzymes: No results for input(s): CKTOTAL, CKMB, CKMBINDEX, TROPONINI in the last 168 hours. No results for input(s): PROBNP in the last 8760 hours. Coagulation Profile: Recent Labs  Lab 04/02/21 0814  INR 1.1   Thyroid Function Tests: No results for input(s): TSH, T4TOTAL, FREET4, T3FREE, THYROIDAB in the last 72 hours. Lipid Profile: No results for input(s): CHOL, HDL, LDLCALC,  TRIG, CHOLHDL, LDLDIRECT in the last 72 hours. Anemia Panel: Recent Labs    04/04/21 0234  VITAMINB12 193  FOLATE 14.4  FERRITIN 216  TIBC 181*  IRON 30  RETICCTPCT 1.5   Urine analysis:    Component Value Date/Time   COLORURINE YELLOW (A) 02/15/2021 2212   APPEARANCEUR HAZY (A) 02/15/2021 2212   LABSPEC 1.021 02/15/2021 2212   PHURINE 6.0 02/15/2021 2212   GLUCOSEU NEGATIVE 02/15/2021 2212   HGBUR NEGATIVE 02/15/2021 2212   BILIRUBINUR NEGATIVE 02/15/2021 2212   KETONESUR 5 (A) 02/15/2021 2212   PROTEINUR NEGATIVE 02/15/2021 2212   NITRITE NEGATIVE 02/15/2021 2212   LEUKOCYTESUR MODERATE (A) 02/15/2021 2212   Sepsis Labs: Invalid input(s): PROCALCITONIN, LACTICIDVEN  Microbiology: Recent Results (from  the past 240 hour(s))  Resp Panel by RT-PCR (Flu A&B, Covid) Nasopharyngeal Swab     Status: Abnormal   Collection Time: 04/02/21 10:43 AM   Specimen: Nasopharyngeal Swab; Nasopharyngeal(NP) swabs in vial transport medium  Result Value Ref Range Status   SARS Coronavirus 2 by RT PCR POSITIVE (A) NEGATIVE Final    Comment: RESULT CALLED TO, READ BACK BY AND VERIFIED WITH: TIFFANIE ROBINSON 1158 04/02/2021 DLB (NOTE) SARS-CoV-2 target nucleic acids are DETECTED.  The SARS-CoV-2 RNA is generally detectable in upper respiratory specimens during the acute phase of infection. Positive results are indicative of the presence of the identified virus, but do not rule out bacterial infection or co-infection with other pathogens not detected by the test. Clinical correlation with patient history and other diagnostic information is necessary to determine patient infection status. The expected result is Negative.  Fact Sheet for Patients: BloggerCourse.com  Fact Sheet for Healthcare Providers: SeriousBroker.it  This test is not yet approved or cleared by the [place] FDA and  has been authorized for detection and/or  diagnosis of SARS-CoV-2 by FDA under an Emergency Use Authorization (EUA).  This EUA will remain in effect (meaning this test can  be used) for the duration of  the COVID-19 declaration under Section 564(b)(1) of the Act, 21 U.S.C. section 360bbb-3(b)(1), unless the authorization is terminated or revoked sooner.     Influenza A by PCR NEGATIVE NEGATIVE Final   Influenza B by PCR NEGATIVE NEGATIVE Final    Comment: (NOTE) The Xpert Xpress SARS-CoV-2/FLU/RSV plus assay is intended as an aid in the diagnosis of influenza from Nasopharyngeal swab specimens and should not be used as a sole basis for treatment. Nasal washings and aspirates are unacceptable for Xpert Xpress SARS-CoV-2/FLU/RSV testing.  Fact Sheet for Patients: BloggerCourse.com  Fact Sheet for Healthcare Providers: SeriousBroker.it  This test is not yet approved or cleared by the [place] FDA and has been authorized for detection and/or diagnosis of SARS-CoV-2 by FDA under an Emergency Use Authorization (EUA). This EUA will remain in effect (meaning this test can be used) for the duration of the COVID-19 declaration under Section 564(b)(1) of the Act, 21 U.S.C. section 360bbb-3(b)(1), unless the authorization is terminated or revoked.  Performed at [facility], [address] [place], [place] [address]   MRSA PCR Screening     Status: None   Collection Time: 04/02/21  3:05 PM   Specimen: Nasopharyngeal  Result Value Ref Range Status   MRSA by PCR NEGATIVE NEGATIVE Final    Comment:        The GeneXpert MRSA Assay (FDA approved for NASAL specimens only), is one component of a comprehensive MRSA colonization surveillance program. It is not intended to diagnose MRSA infection nor to guide or monitor treatment for MRSA infections. Performed at [facility], [address], [place], [place] [address]     Radiology Studies: PERIPHERAL  VASCULAR CATHETERIZATION  Result Date: 04/03/2021 See surgical note for result.    Tjay Velazquez T. Gumaro [facility]  If 7PM-7AM, please contact night-coverage [email or web address] 04/04/2021, 3:01 PM

## 2021-04-04 NOTE — Evaluation (Signed)
Physical Therapy Evaluation Patient Details Name: Anne Harrison MRN: [id number] DOB: September 04, 1924 Today's Date: 04/04/2021   History of Present Illness  presented to ER secondary to R LE swelling, redness; admitted for management of R LE DVT and acute PE.  S/p R LE thrombectomy (04/03/21)  Clinical Impression  Patient resting in bed upon arrival to room; alert and oriented to self only.  Follows simple commands, but often requires return demonstration or hand-over-hand assist to fully comprehend/complete task.  Denies pain at this time.  Bilat UE/LE strength and ROM grossly symmetrical and WFL; no focal weakness appreciated.  Currently requiring mod assist for bed mobility; mod assist for sit/stand, basic transfers and gait (5' x 1, 15' x1) with RW.  Demonstrates very short, shuffling steps; constant assist from therapist for walker advancement and management along pathway.  Poor balance reactions, high fall risk.  Hands-on assist and RW required at all times.  Mod SOB with exertion; sats 89% on RA. Would benefit from skilled PT to address above deficits and promote optimal return to PLOF.; recommend transition to STR upon discharge from acute hospitalization.     Follow Up Recommendations SNF    Equipment Recommendations       Recommendations for Other Services       Precautions / Restrictions Precautions Precautions: Fall Restrictions Weight Bearing Restrictions: No      Mobility  Bed Mobility Overal bed mobility: Needs Assistance Bed Mobility: Supine to Sit     Supine to sit: Mod assist     General bed mobility comments: limited ability to initiate, sequence task; generally rigid throughout body, limited ability to dissociate trunk and extremities    Transfers Overall transfer level: Needs assistance Equipment used: Rolling walker (2 wheeled) Transfers: Sit to/from Stand Sit to Stand: Mod assist         General transfer comment: extensive assist for lift off, anterior  weight translation (to bring COB over BOS); limited/no anticipatory balance reactions, very high fall risk  Ambulation/Gait Ambulation/Gait assistance: Mod assist Gait Distance (Feet):  (5' x1, 15' x1) Assistive device: Rolling walker (2 wheeled)       General Gait Details: very short, shuffling steps; constant assist from therapist for walker advancement and management along pathway.  Poor balance reactions, high fall risk.  Hands-on assist and RW required at all times.  Mod SOB with exertion; sats 89% on RA  Stairs            Wheelchair Mobility    Modified Rankin (Stroke Patients Only)       Balance Overall balance assessment: Needs assistance Sitting-balance support: No upper extremity supported;Feet supported Sitting balance-Leahy Scale: Good     Standing balance support: Bilateral upper extremity supported Standing balance-Leahy Scale: Poor                               Pertinent Vitals/Pain Pain Assessment: No/denies pain    Home Living Family/patient expects to be discharged to:: Assisted living               Home Equipment: Walker - 4 wheels Additional Comments: Patient poor historian, unable to provide details.  Per daughter, resident of [facility]    Prior Function           Comments: Patient poor historian, unable to provide details.  Per daughter, ambulatory with [phone], goes shopping with daughter every Wednesday and Saturday. No home O2.  Hand Dominance        Extremity/Trunk Assessment   Upper Extremity Assessment Upper Extremity Assessment: Overall WFL for tasks assessed (grossly at least 4/5 throughout)    Lower Extremity Assessment Lower Extremity Assessment: Generalized weakness (grossly at leat 4/5 throughout; no focal weakness appreciated)       Communication   Communication: HOH (intermittent word-finding difficulties)  Cognition Arousal/Alertness: Awake/alert Behavior During Therapy: WFL for tasks  assessed/performed Overall Cognitive Status: No family/caregiver present to determine baseline cognitive functioning                                 General Comments: oriented to self only; inconsistently follows simple commands, performing best with return demonstration or hand-over-hand assist from therapist.  Pleasant and cooperative throughout; very limited insight/awareness into deficits, limited ability to recall/integrate new information      General Comments      Exercises Other Exercises Other Exercises: Sit/stand x2 with RW, mod assist for lift off, balance, midlime Other Exercises: Bed/chair with RW, mod assist-very unsteady; constant assist for walker management and positioning Other Exercises: Attempted education in pursed lip breathing, required therapist demonstration to comprehend/complete task; limited ability to carry-over and integrate into subsequent tasks.   Assessment/Plan    PT Assessment Patient needs continued PT services  PT Problem List Decreased strength;Decreased activity tolerance;Decreased balance;Decreased mobility;Decreased coordination;Decreased cognition;Decreased knowledge of use of DME;Decreased safety awareness;Decreased knowledge of precautions;Cardiopulmonary status limiting activity       PT Treatment Interventions DME instruction;Gait training;Stair training;Functional mobility training;Therapeutic activities;Therapeutic exercise;Balance training;Patient/family education;Cognitive remediation    PT Goals (Current goals can be found in the Care Plan section)  Acute Rehab PT Goals PT Goal Formulation: Patient unable to participate in goal setting Time For Goal Achievement: 04/18/21 Potential to Achieve Goals: Fair    Frequency Min 2X/week   Barriers to discharge Decreased caregiver support      Co-evaluation               AM-PAC PT "6 Clicks" Mobility  Outcome Measure Help needed turning from your back to your side  while in a flat bed without using bedrails?: A Little Help needed moving from lying on your back to sitting on the side of a flat bed without using bedrails?: A Lot Help needed moving to and from a bed to a chair (including a wheelchair)?: A Lot Help needed standing up from a chair using your arms (e.g., wheelchair or bedside chair)?: A Lot Help needed to walk in hospital room?: A Lot Help needed climbing 3-5 steps with a railing? : A Lot 6 Click Score: 13    End of Session Equipment Utilized During Treatment: Gait belt Activity Tolerance: Patient tolerated treatment well Patient left: in chair;with call bell/phone within reach;with chair alarm set Nurse Communication: Mobility status PT Visit Diagnosis: Muscle weakness (generalized) (M62.81);Difficulty in walking, not elsewhere classified (R26.2)    Time: 1856-3149 PT Time Calculation (min) (ACUTE ONLY): 27 min   Charges:   PT Evaluation $PT Eval Moderate Complexity: 1 Mod PT Treatments $Therapeutic Activity: 8-22 mins        Keta [place]burgh H. Manson Passey, PT, DPT, NCS 04/04/21, 2:34 PM [phone]

## 2021-04-04 NOTE — Consult Note (Signed)
ANTICOAGULATION CONSULT NOTE  Pharmacy Consult for Heparin  Indication: pulmonary embolus and DVT  Allergies  Allergen Reactions  . Ciprofloxacin Hives    Patient Measurements: Height: 4\' 11"  (149.9 cm) Weight: 51.9 kg (114 lb 8 oz) IBW/kg (Calculated) : 43.2 Heparin Dosing Weight: 51.9 kg  Vital Signs: Temp: 97.3 F (36.3 C) (06/08 0101) Temp Source: Oral (06/08 0101) BP: 143/60 (06/08 0101) Pulse Rate: 69 (06/08 0101)  Labs: Recent Labs    04/02/21 0814 04/02/21 1900 04/03/21 0519 04/03/21 1355 04/04/21 0234  HGB 11.6*  --  9.9*  --  10.2*  HCT 35.3*  --  29.8*  --  31.3*  PLT 187  --  188  --  192  APTT 53*  --   --   --   --   LABPROT 14.3  --   --   --   --   INR 1.1  --   --   --   --   HEPARINUNFRC  --    < > 0.23* 0.32 <0.10*  CREATININE 0.73  --  0.69  --   --    < > = values in this interval not displayed.    Estimated Creatinine Clearance: 31.7 mL/min (by C-G formula based on SCr of 0.69 mg/dL).   Medical History: Past Medical History:  Diagnosis Date  . Bronchitis, not specified as acute or chronic   . Essential (primary) hypertension   . Other emphysema (HCC)   . Tinea unguium   . Urge incontinence     Medications:  Medications Prior to Admission  Medication Sig Dispense Refill Last Dose  . acetaminophen (TYLENOL) 325 MG tablet Take 650 mg by mouth 3 (three) times daily.   04/01/2021 at 2100  . acetaminophen (TYLENOL) 325 MG tablet Take 325 mg by mouth every 4 (four) hours as needed for mild pain.   Unknown at PRN  . albuterol (VENTOLIN HFA) 108 (90 Base) MCG/ACT inhaler Inhale 1 puff into the lungs 2 (two) times daily.   04/01/2021 at 2100  . amLODipine (NORVASC) 5 MG tablet Take 5 mg by mouth daily.    04/01/2021 at 0900  . ascorbic acid (VITAMIN C) 500 MG tablet Take 1,000 mg by mouth daily.     . benzocaine (ORAJEL) 10 % mucosal gel Use as directed 1 application in the mouth or throat every 4 (four) hours as needed for mouth pain.   Unknown at  PRN  . Calcium Carb-Cholecalciferol (OYSTER SHELL CALCIUM) 500-400 MG-UNIT TABS Take 1 tablet by mouth daily.   04/01/2021 at 0900  . metoprolol succinate (TOPROL-XL) 25 MG 24 hr tablet Take 25 mg by mouth at bedtime.    04/01/2021 at 2100   Scheduled:  . amLODipine  5 mg Oral Daily  . ascorbic acid  1,000 mg Oral Daily  . calcium-vitamin D  1 tablet Oral Q breakfast  . heparin  1,500 Units Intravenous Once  . heparin sodium (porcine)      . metoprolol succinate  25 mg Oral QHS  . pneumococcal 23 valent vaccine  0.5 mL Intramuscular Tomorrow-1000   Infusions:  . sodium chloride 75 mL/hr at 04/03/21 0108  . heparin 1,100 Units/hr (04/03/21 1838)   PRN: acetaminophen, albuterol, benzocaine, dextromethorphan-guaiFENesin, hydrALAZINE, ondansetron (ZOFRAN) IV, ondansetron (ZOFRAN) IV Anti-infectives (From admission, onward)   Start     Dose/Rate Route Frequency Ordered Stop   04/03/21 1510  sodium chloride 0.9 % with ceFAZolin (ANCEF) ADS Med  Note to Pharmacy: Rita Ohara   : cabinet override      04/03/21 1510 04/04/21 0314   04/03/21 1444  ceFAZolin (ANCEF) 2-4 GM/100ML-% IVPB       Note to Pharmacy: Rita Ohara   : cabinet override      04/03/21 1444 04/03/21 1516      Assessment: Pharmacy consulted to start heparin for PE and DVT. No DOAC PTA. CBC stable. Chest CT: Single small pulmonary embolus in a subsegmental RIGHT lower lobe pulmonary artery. RLE doppler shows acute DVT extending from the calf veins to the common femoral vein. Plan for thrombectomy on 6/7.   6/6 1900 HL = 0.16, subtherapeutic @ 800 units/hr 6/7 0519 HL = 0.23, subtherapeutic @ 1000 units/hr 6/7 1355 HL = 0.32, therapeutic @ 1100 units/hr 6/8 0234 HL = < 0.1, subtherapeutic   Goal of Therapy:  Heparin level 0.3-0.7 units/ml Monitor platelets by anticoagulation protocol: Yes   Plan:  6/8: HL @ 0234 = < 0.1 Will order heparin 1500 units IV X 1 bolus and increase drip rate to 1300 units/hr.   Will recheck HL 8 hrs after rate change.   Dora Clauss D, PharmD 04/04/2021,3:27 AM

## 2021-04-04 NOTE — Consult Note (Signed)
ANTICOAGULATION CONSULT NOTE  Pharmacy Consult for Heparin  Indication: pulmonary embolus and DVT  Patient Measurements: Heparin Dosing Weight: 51.9 kg  Labs: Recent Labs    04/02/21 0814 04/02/21 1900 04/03/21 0519 04/03/21 1355 04/04/21 0234 04/04/21 1231 04/04/21 2022  HGB 11.6*  --  9.9*  --  10.2*  --   --   HCT 35.3*  --  29.8*  --  31.3*  --   --   PLT 187  --  188  --  192  --   --   APTT 53*  --   --   --   --   --   --   LABPROT 14.3  --   --   --   --   --   --   INR 1.1  --   --   --   --   --   --   HEPARINUNFRC  --    < > 0.23*   < > <0.10* 0.46 0.45  CREATININE 0.73  --  0.69  --   --   --   --    < > = values in this interval not displayed.    Estimated Creatinine Clearance: 31.7 mL/min (by C-G formula based on SCr of 0.69 mg/dL).   Medical History: Past Medical History:  Diagnosis Date  . Bronchitis, not specified as acute or chronic   . Essential (primary) hypertension   . Other emphysema (HCC)   . Tinea unguium   . Urge incontinence     Infusions:  . heparin 1,300 Units/hr (04/04/21 0407)   Assessment: Pharmacy consulted to start heparin for PE and DVT. No DOAC PTA. CBC stable. Chest CT: Single small pulmonary embolus in a subsegmental RIGHT lower lobe pulmonary artery. RLE doppler shows acute DVT extending from the calf veins to the common femoral vein. S/p thrombectomy on 6/7.  Hgb: 10.2 and plts: 192  6/6 1900 HL = 0.16, subtherapeutic @ 800 units/hr 6/7 0519 HL = 0.23, subtherapeutic @ 1000 units/hr 6/7 1355 HL = 0.32, therapeutic @ 1100 units/hr 6/8 0234 HL = < 0.1, subtherapeutic  6/8 1231 HL = 0.46, therapeutic x 1 6/8 2022 HL = 0.45, therapeutic x 2  Goal of Therapy:  Heparin level 0.3-0.7 units/ml Monitor platelets by anticoagulation protocol: Yes   Plan:  --Heparin level is therapeutic x 2 --Continue heparin infusion at 1300 units/hr  --Re-check HL tomorrow AM --CBC daily per protocol while on IV heparin    Tressie Ellis 04/04/2021,8:58 PM

## 2021-04-04 NOTE — [place] FL2 (Signed)
[place] MEDICAID FL2 LEVEL OF CARE SCREENING TOOL     IDENTIFICATION  Patient Name: Anne Harrison Birthdate: Feb 11, 1924 Sex: female Admission Date (Current Location): 04/02/2021  [place] and [place] Number:  Chiropodist and Address:  [facility], [address] [place], [place] [address]      Provider Number: [id number]  Attending Physician Name and Address:  Almon Hercules, MD  Relative Name and Phone Number:  Levert Feinstein (daughter) [phone]    Current Level of Care: Hospital Recommended Level of Care: Skilled Nursing Facility Prior Approval Number:    Date Approved/Denied:   PASRR Number: [id number]  Discharge Plan: SNF    Current Diagnoses: Patient Active Problem List   Diagnosis Date Noted  . Pulmonary embolism (HCC) 04/02/2021  . DVT (deep venous thrombosis) (HCC)_right leg 04/02/2021  . COVID-19 virus infection 04/02/2021  . Other emphysema (HCC)   . Onychomycosis 10/22/2017  . Essential hypertension 10/07/2017    Orientation RESPIRATION BLADDER Height & Weight     Self,Situation,Place  Normal External catheter Weight: 51.9 kg Height:  4\' 11"  (149.9 cm)  BEHAVIORAL SYMPTOMS/MOOD NEUROLOGICAL BOWEL NUTRITION STATUS      Continent Diet (Regular)  AMBULATORY STATUS COMMUNICATION OF NEEDS Skin   Limited Assist Verbally Normal                       Personal Care Assistance Level of Assistance  Bathing,Feeding,Dressing Bathing Assistance: Limited assistance Feeding assistance: Limited assistance Dressing Assistance: Limited assistance     Functional Limitations Info  Hearing   Hearing Info: Impaired      SPECIAL CARE FACTORS FREQUENCY  PT (By licensed PT),OT (By licensed OT)     PT Frequency: 5 times per week OT Frequency: 5 times per week            Contractures Contractures Info: Not present    Additional Factors Info  Code Status,Allergies Code Status Info: DNR Allergies Info:  Ciprofloxacin           Current Medications (04/04/2021):  This is the current hospital active medication list Current Facility-Administered Medications  Medication Dose Route Frequency Provider Last Rate Last Admin  . acetaminophen (TYLENOL) suppository 650 mg  650 mg Rectal Q6H PRN Schnier, 06/04/2021, MD      . albuterol (PROVENTIL) (2.5 MG/3ML) 0.083% nebulizer solution 2.5 mg  2.5 mg Nebulization Q4H PRN Schnier, Latina Craver, MD      . amLODipine (NORVASC) tablet 5 mg  5 mg Oral Daily Schnier, Latina Craver, MD   5 mg at 04/04/21 0948  . ascorbic acid (VITAMIN C) tablet 1,000 mg  1,000 mg Oral Daily Schnier, 06/04/21, MD   1,000 mg at 04/04/21 0948  . benzocaine (ORAJEL) 10 % mucosal gel 1 application  1 application Mouth/Throat Q4H PRN Schnier, 06/04/21, MD      . calcium-vitamin D (OSCAL WITH D) 500-200 MG-UNIT per tablet 1 tablet  1 tablet Oral Q breakfast Schnier, Latina Craver, MD   1 tablet at 04/04/21 [phone]  . dextromethorphan-guaiFENesin (MUCINEX DM) 30-600 MG per 12 hr tablet 1 tablet  1 tablet Oral BID PRN Schnier, 5573, MD      . heparin ADULT infusion 100 units/mL (25000 units/232mL)  1,300 Units/hr Intravenous Continuous Regalado, Belkys A, MD 13 mL/hr at 04/04/21 0407 1,300 Units/hr at 04/04/21 0407  . hydrALAZINE (APRESOLINE) injection 5 mg  5 mg Intravenous Q2H PRN Schnier, 06/04/21, MD      .  metoprolol succinate (TOPROL-XL) 24 hr tablet 25 mg  25 mg Oral QHS Schnier, Latina Craver, MD   25 mg at 04/03/21 2118  . ondansetron (ZOFRAN) injection 4 mg  4 mg Intravenous Q8H PRN Schnier, Latina Craver, MD      . ondansetron [facility]) injection 4 mg  4 mg Intravenous Q6H PRN Schnier, Latina Craver, MD         Discharge Medications: Please see discharge summary for a list of discharge medications.  Relevant Imaging Results:  Relevant Lab Results:   Additional Information SS# [id number]  Allayne Butcher, RN

## 2021-04-05 LAB — CBC
HCT: 28.5 % — ABNORMAL LOW (ref 36.0–46.0)
Hemoglobin: 9.4 g/dL — ABNORMAL LOW (ref 12.0–15.0)
MCH: 30.3 pg (ref 26.0–34.0)
MCHC: 33 g/dL (ref 30.0–36.0)
MCV: 91.9 fL (ref 80.0–100.0)
Platelets: 170 10*3/uL (ref 150–400)
RBC: 3.1 MIL/uL — ABNORMAL LOW (ref 3.87–5.11)
RDW: 14.4 % (ref 11.5–15.5)
WBC: 9.9 10*3/uL (ref 4.0–10.5)
nRBC: 0 % (ref 0.0–0.2)

## 2021-04-05 LAB — HEPARIN LEVEL (UNFRACTIONATED): Heparin Unfractionated: 0.56 IU/mL (ref 0.30–0.70)

## 2021-04-05 MED ORDER — APIXABAN 5 MG PO TABS
10.0000 mg | ORAL_TABLET | Freq: Two times a day (BID) | ORAL | Status: DC
  Administered 2021-04-05: 10:00:00 10 mg via ORAL
  Filled 2021-04-05: qty 2

## 2021-04-05 MED ORDER — APIXABAN 5 MG PO TABS: 10.0000 mg | ORAL_TABLET | Freq: Two times a day (BID) | ORAL | Status: DC

## 2021-04-05 MED ORDER — APIXABAN 5 MG PO TABS: 5.0000 mg | ORAL_TABLET | Freq: Two times a day (BID) | ORAL | Status: DC

## 2021-04-05 NOTE — Discharge Summary (Signed)
Physician Discharge Summary  Fawn KirkMarjorie Snavely BJY:[id number] DOB: 09/15/1924 DOA: 04/02/2021  PCP: Almetta LovelyHousecalls, Doctors Making  Admit date: 04/02/2021 Discharge date: 04/05/2021  Admitted From: ALF  Disposition:  SNF   Recommendations for Outpatient Follow-up:  Follow up with PCP in 1-2 weeks Follow up with Dr. Gilda Harrison Vascular Surgery as directed Continue apixaban/Eliquis 10 mg BID for 7 days, then on 04/12/21 reduce to apixaban 5 mg BID for at least 3 months      Home Health: N/A  Equipment/Devices: TBD at SNF  Discharge Condition: Good  CODE STATUS: DNR Diet recommendation: Regular  Brief/Interim Summary: Anne Harrison is a 87 y.o. F with dementia, lives in memory care ALF, HTN, emphysema, and recent COVID-19 infection in 01/2021 presenting with RLE swelling and shortness of breath.  In the ER, found to have RLL PE and extensive RLE DVT.  She was started on IV heparin.          PRINCIPAL HOSPITAL DIAGNOSIS: Acute DVT/PE    Discharge Diagnoses: Extensive RLE DVT Subsegmental RLL PE without heart strain S/p thrombectomy and thrombolysis by Dr. Gilda Harrison on 6/7 Admitted on heparin gtt.  Vascular surgery consulted and recommended thrombectomy.  She underwent thrombectomy with thrombolysis of RLE DVT and PTA from right superficial femoral to common iliac veins by vascular surgery on 6/7.   Symptoms started to improve.    Continue apixaban/Eliquis 10 mg BID for 7 days, then on 04/12/21 reduce to apixaban 5 mg BID for at least 3 months     Essential hypertension Continue with amlodipine, metoprolol.   Emphysema No active disease   Normocytic anemia Anemia of chronic disease, likely related to emphysema, recent COVID  Dementia without behavioral disturbance   History of COVID-19 infection Recent mild COVID infection, now resolved.  Was positive in April 2022.  COVID-19 screen on admission reactive -No indication for contact or airborne precautions.               Discharge Instructions   Allergies as of 04/05/2021       Reactions   Ciprofloxacin Hives        Medication List     TAKE these medications    acetaminophen 325 MG tablet Commonly known as: TYLENOL Take 650 mg by mouth 3 (three) times daily.   acetaminophen 325 MG tablet Commonly known as: TYLENOL Take 325 mg by mouth every 4 (four) hours as needed for mild pain.   albuterol 108 (90 Base) MCG/ACT inhaler Commonly known as: VENTOLIN HFA Inhale 1 puff into the lungs 2 (two) times daily.   amLODipine 5 MG tablet Commonly known as: NORVASC Take 5 mg by mouth daily.   apixaban 5 MG Tabs tablet Commonly known as: ELIQUIS Take 2 tablets (10 mg total) by mouth 2 (two) times daily.   ascorbic acid 500 MG tablet Commonly known as: VITAMIN C Take 1,000 mg by mouth daily.   benzocaine 10 % mucosal gel Commonly known as: ORAJEL Use as directed 1 application in the mouth or throat every 4 (four) hours as needed for mouth pain.   metoprolol succinate 25 MG 24 hr tablet Commonly known as: TOPROL-XL Take 25 mg by mouth at bedtime.   Oyster Shell Calcium 500-400 MG-UNIT Tabs Take 1 tablet by mouth daily.        Follow-up Information     Schnier, Anne CraverGregory G, MD Follow up in 1 week(s).   Specialties: Vascular Surgery, Cardiology, Radiology, Vascular Surgery Why: Seen as consult. DVT. Will need right lower extremity  DVT study with visit.  Contact information: [address] [place] [address] [phone]                Allergies  Allergen Reactions   Ciprofloxacin Hives    Consultations: Vascular Surgery   Procedures/Studies: DG Ankle Complete Right  Result Date: 04/02/2021 CLINICAL DATA:  Soft tissue swelling and redness EXAM: RIGHT ANKLE - COMPLETE 3+ VIEW COMPARISON:  None. FINDINGS: Frontal, oblique, and lateral views were obtained. There is soft tissue swelling. No evident fracture or joint effusion. No joint space narrowing  or erosion. There is a small posterior calcaneal spur. Ankle mortise appears intact. No bony destruction. IMPRESSION: Soft tissue swelling. No fracture or appreciable arthropathy. Bony structures intact. Small posterior calcaneal spur. Electronically Signed   By: Bretta Bang III M.D.   On: 04/02/2021 08:51   CT Angio Chest PE W and/or Wo Contrast  Result Date: 04/02/2021 CLINICAL DATA:  RIGHT leg redness and swelling, dementia, high clinical suspicion of pulmonary embolism EXAM: CT ANGIOGRAPHY CHEST WITH CONTRAST TECHNIQUE: Multidetector CT imaging of the chest was performed using the standard protocol during bolus administration of intravenous contrast. Multiplanar CT image reconstructions and MIPs were obtained to evaluate the vascular anatomy. CONTRAST:  52mL OMNIPAQUE IOHEXOL 350 MG/ML SOLN IV COMPARISON:  None FINDINGS: Cardiovascular: Atherosclerotic calcifications aorta, coronary arteries and proximal great vessels. Aorta normal caliber without aneurysm or dissection. Thrombus identified within descending thoracic aorta. Pulmonary arteries well opacified. Single small pulmonary embolus identified within a subsegmental RIGHT lower lobe pulmonary artery. Remaining pulmonary arteries patent. Central pulmonary arteries are borderline dilated. Minimal pericardial fluid. Cardiac chambers unremarkable. Mediastinum/Nodes: Large hiatal hernia. Esophagus unremarkable. Few normal size mediastinal lymph nodes. No thoracic adenopathy. Lungs/Pleura: Minimal subsegmental atelectasis at lung bases. Lungs otherwise clear. No pulmonary infiltrate, pleural effusion, or pneumothorax. Upper Abdomen: VP shunt tubing traverses anterior RIGHT chest wall into abdomen. Gallbladder surgically absent. Visualized upper abdomen otherwise unremarkable Musculoskeletal: Osseous demineralization. Review of the MIP images confirms the above findings. IMPRESSION: Single small pulmonary embolus in a subsegmental RIGHT lower lobe  pulmonary artery. Minimal subsegmental atelectasis at lung bases. Large hiatal hernia. Aortic Atherosclerosis (ICD10-I70.0). Findings called to Dr. Katrinka Blazing on 04/02/2021 at 1018 hours. Electronically Signed   By: Ulyses Southward M.D.   On: 04/02/2021 10:18   PERIPHERAL VASCULAR CATHETERIZATION  Result Date: 04/03/2021 See surgical note for result.  US Venous Img Lower Unilateral Right  Result Date: 04/02/2021 CLINICAL DATA:  87 year old female with right lower extremity swelling. EXAM: RIGHT LOWER EXTREMITY VENOUS DOPPLER ULTRASOUND TECHNIQUE: Gray-scale sonography with graded compression, as well as color Doppler and duplex ultrasound were performed to evaluate the right lower extremity deep venous systems from the level of the common femoral vein and including the common femoral, femoral, profunda femoral, popliteal and calf veins including the posterior tibial, peroneal and gastrocnemius veins when visible. Spectral Doppler was utilized to evaluate flow at rest and with distal augmentation maneuvers in the common femoral, femoral and popliteal veins. The contralateral common femoral vein was also evaluated for comparison. COMPARISON:  None. FINDINGS: RIGHT LOWER EXTREMITY Common Femoral Vein: Acute, expansile, occlusive thrombus. Central Greater Saphenous Vein: Acute, expansile, occlusive thrombus. Central Profunda Femoral Vein: Acute, expansile, occlusive thrombus. Femoral Vein: Acute, expansile, occlusive thrombus. Popliteal Vein: Acute, expansile, mostly occlusive thrombus. Calf Veins: Acute, expansile, occlusive thrombus. Other Findings:  Subcutaneous edema about the calf. LEFT LOWER EXTREMITY Common Femoral Vein: No evidence of thrombus. Normal compressibility, respiratory phasicity and response to augmentation. IMPRESSION: Acute deep  vein thrombosis extending from the calf veins to the common femoral vein. The central aspect of thrombus is not identified. Marliss Coots, MD [facility] Electronically Signed   By: Marliss Coots MD   On: 04/02/2021 09:29      Subjective: No chest pain, no dyspnea, no syncope.  Appetite low, leg swelling improving.      Discharge Exam: Vitals:   04/05/21 0532 04/05/21 1002  BP: (!) 103/53 (!) 111/51  Pulse: 64 70  Resp: 17 18  Temp: 98.7 F (37.1 C) 98 F (36.7 C)  SpO2: 93% 93%   Vitals:   04/04/21 2007 04/05/21 0056 04/05/21 0532 04/05/21 1002  BP: (!) 122/59 (!) 113/53 (!) 103/53 (!) 111/51  Pulse: 73 69 64 70  Resp: 16 16 17 18   Temp: 98.6 F (37 C) 97.7 F (36.5 C) 98.7 F (37.1 C) 98 F (36.7 C)  TempSrc: Oral Oral Oral Oral  SpO2: 93% 93% 93% 93%  Weight:      Height:        General: Pt is alert, awake, not in acute distress, sitting in recliner HEENT: Dentures in place, OP dry, no oral lesions. Cardiovascular: RRR, nl S1-S2, no murmurs appreciated.   No LE edema.   Respiratory: Normal respiratory rate and rhythm.  CTAB without rales or wheezes. Abdominal: Abdomen soft and non-tender.  No distension or HSM.   Neuro/Psych: Strength symmetric in upper and lower extremities, generally weak.  Judgment and insight appear moderately impaired by dementia.   The results of significant diagnostics from this hospitalization (including imaging, microbiology, ancillary and laboratory) are listed below for reference.     Microbiology: Recent Results (from the past 240 hour(s))  Resp Panel by RT-PCR (Flu A&B, Covid) Nasopharyngeal Swab     Status: Abnormal   Collection Time: 04/02/21 10:43 AM   Specimen: Nasopharyngeal Swab; Nasopharyngeal(NP) swabs in vial transport medium  Result Value Ref Range Status   SARS Coronavirus 2 by RT PCR POSITIVE (A) NEGATIVE Final    Comment: RESULT CALLED TO, READ BACK BY AND VERIFIED WITH: TIFFANIE ROBINSON 1158 04/02/2021 DLB (NOTE) SARS-CoV-2 target nucleic acids are DETECTED.  The SARS-CoV-2 RNA is generally detectable in upper  respiratory specimens during the acute phase of infection. Positive results are indicative of the presence of the identified virus, but do not rule out bacterial infection or co-infection with other pathogens not detected by the test. Clinical correlation with patient history and other diagnostic information is necessary to determine patient infection status. The expected result is Negative.  Fact Sheet for Patients: 06/02/2021  Fact Sheet for Healthcare Providers: BloggerCourse.com  This test is not yet approved or cleared by the [place] FDA and  has been authorized for detection and/or diagnosis of SARS-CoV-2 by FDA under an Emergency Use Authorization (EUA).  This EUA will remain in effect (meaning this test can  be used) for the duration of  the COVID-19 declaration under Section 564(b)(1) of the Act, 21 U.S.C. section 360bbb-3(b)(1), unless the authorization is terminated or revoked sooner.     Influenza A by PCR NEGATIVE NEGATIVE Final   Influenza B by PCR NEGATIVE NEGATIVE Final    Comment: (NOTE) The Xpert Xpress SARS-CoV-2/FLU/RSV plus assay is intended as an aid in the diagnosis of influenza from Nasopharyngeal swab specimens and should not be used as a sole basis for treatment. Nasal washings and aspirates are unacceptable for Xpert Xpress SARS-CoV-2/FLU/RSV testing.  Fact Sheet for Patients: [place]  Fact Sheet for Healthcare Providers: SeriousBroker.it  This test is not yet approved or cleared by the [place] FDA and has been authorized for detection and/or diagnosis of SARS-CoV-2 by FDA under an Emergency Use Authorization (EUA). This EUA will remain in effect (meaning this test can be used) for the duration of the COVID-19 declaration under Section 564(b)(1) of the Act, 21 U.S.C. section 360bbb-3(b)(1), unless the authorization is  terminated or revoked.  Performed at [facility], [address] [place], [place] [address]   MRSA PCR Screening     Status: None   Collection Time: 04/02/21  3:05 PM   Specimen: Nasopharyngeal  Result Value Ref Range Status   MRSA by PCR NEGATIVE NEGATIVE Final    Comment:        The GeneXpert MRSA Assay (FDA approved for NASAL specimens only), is one component of a comprehensive MRSA colonization surveillance program. It is not intended to diagnose MRSA infection nor to guide or monitor treatment for MRSA infections. Performed at [facility], [address] [place], [place] [address]      Labs: BNP (last 3 results) No results for input(s): BNP in the last 8760 hours. Basic Metabolic Panel: Recent Labs  Lab 04/02/21 0814 04/03/21 0519  NA 135 137  K 4.0 3.8  CL 98 102  CO2 26 27  GLUCOSE 103* 83  BUN 15 11  CREATININE 0.73 0.69  CALCIUM 9.1 8.5*   Liver Function Tests: No results for input(s): AST, ALT, ALKPHOS, BILITOT, PROT, ALBUMIN in the last 168 hours. No results for input(s): LIPASE, AMYLASE in the last 168 hours. No results for input(s): AMMONIA in the last 168 hours. CBC: Recent Labs  Lab 04/02/21 0814 04/03/21 0519 04/04/21 0234 04/05/21 0348  WBC 9.3 8.4 10.2 9.9  NEUTROABS 5.7  --   --   --   HGB 11.6* 9.9* 10.2* 9.4*  HCT 35.3* 29.8* 31.3* 28.5*  MCV 93.6 92.5 94.3 91.9  PLT 187 188 192 170   Cardiac Enzymes: No results for input(s): CKTOTAL, CKMB, CKMBINDEX, TROPONINI in the last 168 hours. BNP: Invalid input(s): POCBNP CBG: No results for input(s): GLUCAP in the last 168 hours. D-Dimer No results for input(s): DDIMER in the last 72 hours. Hgb A1c No results for input(s): HGBA1C in the last 72 hours. Lipid Profile No results for input(s): CHOL, HDL, LDLCALC, TRIG, CHOLHDL, LDLDIRECT in the last 72 hours. Thyroid function studies No results for input(s): TSH, T4TOTAL, T3FREE, THYROIDAB in the last 72  hours.  Invalid input(s): FREET3 Anemia work up Recent Labs    04/04/21 0234  VITAMINB12 193  FOLATE 14.4  FERRITIN 216  TIBC 181*  IRON 30  RETICCTPCT 1.5   Urinalysis    Component Value Date/Time   COLORURINE YELLOW (A) 02/15/2021 2212   APPEARANCEUR HAZY (A) 02/15/2021 2212   LABSPEC 1.021 02/15/2021 2212   PHURINE 6.0 02/15/2021 2212   GLUCOSEU NEGATIVE 02/15/2021 2212   HGBUR NEGATIVE 02/15/2021 2212   BILIRUBINUR NEGATIVE 02/15/2021 2212   KETONESUR 5 (A) 02/15/2021 2212   PROTEINUR NEGATIVE 02/15/2021 2212   NITRITE NEGATIVE 02/15/2021 2212   LEUKOCYTESUR MODERATE (A) 02/15/2021 2212   Sepsis Labs Invalid input(s): PROCALCITONIN,  WBC,  LACTICIDVEN Microbiology Recent Results (from the past 240 hour(s))  Resp Panel by RT-PCR (Flu A&B, Covid) Nasopharyngeal Swab     Status: Abnormal   Collection Time: 04/02/21 10:43 AM   Specimen: Nasopharyngeal Swab; Nasopharyngeal(NP) swabs in vial transport medium  Result Value  Ref Range Status   SARS Coronavirus 2 by RT PCR POSITIVE (A) NEGATIVE Final    Comment: RESULT CALLED TO, READ BACK BY AND VERIFIED WITH: TIFFANIE ROBINSON 1158 04/02/2021 DLB (NOTE) SARS-CoV-2 target nucleic acids are DETECTED.  The SARS-CoV-2 RNA is generally detectable in upper respiratory specimens during the acute phase of infection. Positive results are indicative of the presence of the identified virus, but do not rule out bacterial infection or co-infection with other pathogens not detected by the test. Clinical correlation with patient history and other diagnostic information is necessary to determine patient infection status. The expected result is Negative.  Fact Sheet for Patients: BloggerCourse.com  Fact Sheet for Healthcare Providers: [employer]iousBroker.it  This test is not yet approved or cleared by the [place] FDA and  has been authorized for detection and/or diagnosis of  SARS-CoV-2 by FDA under an Emergency Use Authorization (EUA).  This EUA will remain in effect (meaning this test can  be used) for the duration of  the COVID-19 declaration under Section 564(b)(1) of the Act, 21 U.S.C. section 360bbb-3(b)(1), unless the authorization is terminated or revoked sooner.     Influenza A by PCR NEGATIVE NEGATIVE Final   Influenza B by PCR NEGATIVE NEGATIVE Final    Comment: (NOTE) The Xpert Xpress SARS-CoV-2/FLU/RSV plus assay is intended as an aid in the diagnosis of influenza from Nasopharyngeal swab specimens and should not be used as a sole basis for treatment. Nasal washings and aspirates are unacceptable for Xpert Xpress SARS-CoV-2/FLU/RSV testing.  Fact Sheet for Patients: BloggerCourse.com  Fact Sheet for Healthcare Providers: [employer]ious[employer]  This test is not yet approved or cleared by the [place] FDA and has been authorized for detection and/or diagnosis of SARS-CoV-2 by FDA under an Emergency Use Authorization (EUA). This EUA will remain in effect (meaning this test can be used) for the duration of the COVID-19 declaration under Section 564(b)(1) of the Act, 21 U.S.C. section 360bbb-3(b)(1), unless the authorization is terminated or revoked.  Performed at [facility], [address] [place], [place] [address]   MRSA PCR Screening     Status: None   Collection Time: 04/02/21  3:05 PM   Specimen: Nasopharyngeal  Result Value Ref Range Status   MRSA by PCR NEGATIVE NEGATIVE Final    Comment:        The GeneXpert MRSA Assay (FDA approved for NASAL specimens only), is one component of a comprehensive MRSA colonization surveillance program. It is not intended to diagnose MRSA infection nor to guide or monitor treatment for MRSA infections. Performed at [facility], [address] [place], [place] [address]      Time coordinating discharge: 25  minutes The [place] controlled substances registry was reviewed for this patient     30 Day Unplanned Readmission Risk Score    Flowsheet Row ED to Hosp-Admission (Current) from 04/02/2021 in [facility] (1C)  30 Day Unplanned Readmission Risk Score (%) 13.1 Filed at 04/05/2021 0801       This score is the patient's risk of an unplanned readmission within 30 days of being discharged (0 -100%). The score is based on dignosis, age, lab data, medications, orders, and past utilization.   Low:  0-14.9   Medium: 15-21.9   High: 22-29.9   Extreme: 30 and above             SIGNED:   Alberteen Sam, MD  [facility] 04/05/2021, 11:04 AM

## 2021-04-05 NOTE — Discharge Instructions (Signed)
Information on my medicine - ELIQUIS (apixaban) This medication education was reviewed with me or my healthcare representative as part of my discharge preparation. The pharmacist that spoke with me during my hospital stay was: ____________________________ (pharmacist name) WHY WAS ELIQUIS PRESCRIBED FOR YOU? Eliquis was prescribed to treat blood clots that may have been found in the veins of your legs (deep vein thrombosis) or in your lungs (pulmonary embolism) and to reduce the risk of them occurring again. WHAT DO YOU NEED TO KNOW ABOUT ELIQUIS ? The starting dose is 10 mg (two 5 mg tablets) taken TWICE daily for the FIRST SEVEN (7) DAYS, then on (enter date) ______________ the dose is reduced to ONE 5 mg tablet taken TWICE daily. Eliquis may be taken with or without food. Try to take the dose about the same time in the morning and in the evening. If you have difficulty swallowing the tablet whole please discuss with your pharmacist how to take the medication safely. Take Eliquis exactly as prescribed and DO NOT stop taking Eliquis without talking to the doctor who prescribed the medication. Stopping may increase your risk of developing a new blood clot. Refill your prescription before you run out. After discharge, you should have regular check-up appointments with your healthcare provider that is prescribing your Eliquis. WHAT DO YOU DO IF YOU MISS A DOSE? If a dose of ELIQUIS is not taken at the scheduled time, take it as soon as possible on the same day and twice-daily administration should be resumed. The dose should not be doubled to make up for a missed dose. IMPORTANT SAFETY INFORMATION A possible side effect of Eliquis is bleeding. You should call your healthcare provider right away if you experience any of the following: ? Bleeding from an injury or your nose that does not stop. ? Unusual colored urine (red or dark brown) or unusual colored stools (red or black). ?  Unusual bruising for unknown reasons. ? A serious fall or if you hit your head (even if there is no bleeding). Some medicines may interact with Eliquis and might increase your risk of bleeding or clotting while on Eliquis. To help avoid this, consult your healthcare provider or pharmacist prior to using any new prescription or non-prescription medications, including herbals, vitamins, non-steroidal anti-inflammatory drugs (NSAIDs) and supplements. This website has more information on Eliquis (apixaban): www.Eliquis.com. 

## 2021-04-05 NOTE — TOC Progression Note (Signed)
Transition of Care [facility]) - Progression Note    Patient Details  Name: Anne Harrison MRN: [id number] Date of Birth: September 26, 1924  [facility]) CM/SW Contact  Allayne Butcher, RN Phone Number: 04/05/2021, 9:05 AM  Clinical Narrative:     RNCM has spoken with Clide Cliff over at [facility] and rehab.  Clide Cliff reports that they will have a bed later this afternoon and will accept patient.  RNCM has initiated insurance authorization through the [facility] Navi Portal.    Expected Discharge Plan: Skilled Nursing Facility Barriers to Discharge: Continued Medical Work up  Expected Discharge Plan and Services Expected Discharge Plan: Skilled Nursing Facility   Discharge Planning Services: CM Consult Post Acute Care Choice: Skilled Nursing Facility Living arrangements for the past 2 months: Assisted Living Facility                 DME Arranged: N/A DME Agency: NA       HH Arranged: NA           Social Determinants of Health (SDOH) Interventions    Readmission Risk Interventions No flowsheet data found.

## 2021-04-05 NOTE — TOC Transition Note (Signed)
Transition of Care [facility]) - CM/SW Discharge Note   Patient Details  Name: Stepheny Canal MRN: [id number] Date of Birth: [birth year]  Transition of Care [facility]) CM/SW Contact:  Allayne Butcher, RN Phone Number: 04/05/2021, 1:20 PM   Clinical Narrative:    Patient medically cleared for discharge to [facility].  Insurance authorization has been approved: [id number], reference [id number], approved starting 6/9 to 6/13.   Patient's daughter will be picking her up and transporting her over to [facility].  Bedside RN will call report.    Final next level of care: Skilled Nursing Facility Barriers to Discharge: Barriers Resolved   Patient Goals and CMS Choice Patient states their goals for this hospitalization and ongoing recovery are:: daughter wants patient to get back to her baseline and be ambulator and go back to [employer].gov Compare Post Acute Care list provided to:: Patient Represenative (must comment) Choice offered to / list presented to : Adult Children  Discharge Placement PASRR number recieved: 04/04/21            Patient chooses bed at: [facility] [place] Patient to be transferred to facility by: Daughter Vickie Name of family member notified: Vickie Patient and family notified of of transfer: 04/05/21  Discharge Plan and Services   Discharge Planning Services: CM Consult Post Acute Care Choice: Skilled Nursing Facility          DME Arranged: N/A DME Agency: NA       HH Arranged: NA          Social Determinants of Health (SDOH) Interventions     Readmission Risk Interventions No flowsheet data found.

## 2021-04-05 NOTE — Evaluation (Signed)
Occupational Therapy Evaluation Patient Details Name: Anne Harrison MRN: [id number] DOB: 1924/05/30 Today's Date: 04/05/2021    History of Present Illness presented to ER secondary to R LE swelling, redness; admitted for management of R LE DVT and acute PE.  S/p R LE thrombectomy (04/03/21)   Clinical Impression   Pt seen for OT evaluation this date in setting of acute hospitalization d/t DVT. Pt poor historian, but chart review indicates she lives in [place] [facility]. Pt presents this date with decreased strength, fxl activity tolerance and baseline cognitive decline; impacting her ability to safely and efficiently perform ADLs/ADL mobility. OT engages pt in seated UB bathing/dressing tasks with MIN A and cues to sequence, engages pt in seated LB dressing to don socks with MIN A, MOD A for standing peri care after small BM and MOD A for clothing mgt over hips. OT providing at least MIN A for balance/steadying continuously throughout this time as well. Pt left in chair with chair alarm. All needs met and in reach. Will continue to follow. Anticipate pt will require STR f/u.     Follow Up Recommendations  SNF    Equipment Recommendations  3 in 1 bedside commode;Tub/shower seat;Other (comment) (2ww)    Recommendations for Other Services       Precautions / Restrictions Precautions Precautions: Fall Restrictions Weight Bearing Restrictions: No      Mobility Bed Mobility Overal bed mobility: Needs Assistance Bed Mobility: Supine to Sit     Supine to sit: Min assist;HOB elevated     General bed mobility comments: increased time, MOD A to scoot to EOB sitting/square hips.    Transfers Overall transfer level: Needs assistance Equipment used: Rolling walker (2 wheeled) Transfers: Sit to/from [facility] Sit to Stand: Min assist Stand pivot transfers: Min assist       General transfer comment: increased time, cues for safety througout.    Balance Overall  balance assessment: Needs assistance Sitting-balance support: No upper extremity supported;Feet supported Sitting balance-Leahy Scale: Good     Standing balance support: Bilateral upper extremity supported Standing balance-Leahy Scale: Poor Standing balance comment: requires UE Support                           ADL either performed or assessed with clinical judgement   ADL Overall ADL's : Needs assistance/impaired                                       General ADL Comments: SETUP to MIN A for seated UB ADLs, MOD A for seated LB ADLs, MOD A For standing peri care, MIN A for transfers with RW with cues to sequence for all tasks performed.     Vision Patient Visual Report: No change from baseline Additional Comments: diffiult to formally assess 2/2 cognition, but pt tracks appropriately.     Perception     Praxis      Pertinent Vitals/Pain Pain Assessment: No/denies pain     Hand Dominance     Extremity/Trunk Assessment Upper Extremity Assessment Upper Extremity Assessment: Overall WFL for tasks assessed;Generalized weakness (ROM WFL, MMT grossly 4-/5 (appropriate given age))   Lower Extremity Assessment Lower Extremity Assessment: Overall WFL for tasks assessed;Generalized weakness (ROM WFL for LB ADLs in sitting assessed.)       Communication Communication Communication: HOH   Cognition Arousal/Alertness: Awake/alert Behavior During Therapy:  WFL for tasks assessed/performed Overall Cognitive Status: Difficult to assess                                 General Comments: oriented to self only; inconsistently follows simple commands, performing best with return demonstration or hand-over-hand assist from therapist.  Pleasant and cooperative throughout; very limited insight/awareness into deficits, limited ability to recall/integrate new information   General Comments       Exercises Other Exercises Other Exercises: OT engages  pt in seated UB bathing/dressing tasks with MIN A and cues to sequence, engages pt in seated LB dressing to don socks with MIN A, MOD A for standing peri care after small BM and MOD A for clothing mgt over hips. OT providing at least MIN A for balnace/steadying continuously throughotu this time as well.   Shoulder Instructions      Home Living Family/patient expects to be discharged to:: Assisted living                             Home Equipment: Walker - 4 wheels   Additional Comments: Patient poor historian, unable to provide details.  Per daughter, resident of [facility]      Prior Functioning/Environment          Comments: Patient poor historian, unable to provide details.  Per daughter, ambulatory with [phone], goes shopping with daughter every Wednesday and Saturday. No home O2.        OT Problem List: Decreased strength;Decreased activity tolerance;Impaired balance (sitting and/or standing);Decreased cognition;Decreased safety awareness;Decreased knowledge of use of DME or AE      OT Treatment/Interventions: Self-care/ADL training;Therapeutic activities;Therapeutic exercise    OT Goals(Current goals can be found in the care plan section) Acute Rehab OT Goals Patient Stated Goal: none stated OT Goal Formulation: Patient unable to participate in goal setting Time For Goal Achievement: 04/19/21 Potential to Achieve Goals: Good ADL Goals Pt Will Transfer to Toilet: with supervision;ambulating (LRAD to/from restroom) Pt Will Perform Toileting - Clothing Manipulation and hygiene: with supervision;sit to/from stand  OT Frequency: Min 1X/week   Barriers to D/C:            Co-evaluation              AM-PAC OT "6 Clicks" Daily Activity     Outcome Measure Help from another person eating meals?: None Help from another person taking care of personal grooming?: A Little Help from another person toileting, which includes using toliet, bedpan, or urinal?:  A Lot Help from another person bathing (including washing, rinsing, drying)?: A Lot Help from another person to put on and taking off regular upper body clothing?: A Little Help from another person to put on and taking off regular lower body clothing?: A Lot 6 Click Score: 16   End of Session Equipment Utilized During Treatment: Gait belt;Rolling walker Nurse Communication: Mobility status  Activity Tolerance: Patient tolerated treatment well Patient left: in chair;with bed alarm set;with chair alarm set;Other (comment) (pure wick in place)  OT Visit Diagnosis: Unsteadiness on feet (R26.81);Muscle weakness (generalized) (M62.81)                Time: 6578-4696 OT Time Calculation (min): 48 min Charges:  OT General Charges $OT Visit: 1 Visit OT Evaluation $OT Eval Moderate Complexity: 1 Mod OT Treatments $Self Care/Home Management : 8-22 mins $Therapeutic Activity: 8-22 mins  Gerrianne Scale, [place], OTR/L ascom [phone] 04/05/21, 2:19 PM

## 2021-04-05 NOTE — Consult Note (Signed)
ANTICOAGULATION CONSULT NOTE  Pharmacy Consult for Heparin  Indication: pulmonary embolus and DVT  Patient Measurements: Heparin Dosing Weight: 51.9 kg  Labs: Recent Labs    04/02/21 0814 04/02/21 1900 04/03/21 0519 04/03/21 1355 04/04/21 0234 04/04/21 1231 04/04/21 2022 04/05/21 0348  HGB 11.6*  --  9.9*  --  10.2*  --   --  9.4*  HCT 35.3*  --  29.8*  --  31.3*  --   --  28.5*  PLT 187  --  188  --  192  --   --  170  APTT 53*  --   --   --   --   --   --   --   LABPROT 14.3  --   --   --   --   --   --   --   INR 1.1  --   --   --   --   --   --   --   HEPARINUNFRC  --    < > 0.23*   < > <0.10* 0.46 0.45 0.56  CREATININE 0.73  --  0.69  --   --   --   --   --    < > = values in this interval not displayed.     Estimated Creatinine Clearance: 31.7 mL/min (by C-G formula based on SCr of 0.69 mg/dL).   Medical History: Past Medical History:  Diagnosis Date   Bronchitis, not specified as acute or chronic    Essential (primary) hypertension    Other emphysema (HCC)    Tinea unguium    Urge incontinence     Infusions:   heparin 1,300 Units/hr (04/05/21 0331)   Assessment: Pharmacy consulted to start heparin for PE and DVT. No DOAC PTA. CBC stable. Chest CT: Single small pulmonary embolus in a subsegmental RIGHT lower lobe pulmonary artery. RLE doppler shows acute DVT extending from the calf veins to the common femoral vein. S/p thrombectomy on 6/7.  Hgb: 10.2 and plts: 192  6/6 1900 HL = 0.16, subtherapeutic @ 800 units/hr 6/7 0519 HL = 0.23, subtherapeutic @ 1000 units/hr 6/7 1355 HL = 0.32, therapeutic @ 1100 units/hr 6/8 0234 HL = < 0.1, subtherapeutic  6/8 1231 HL = 0.46, therapeutic x 1 6/8 2022 HL = 0.45, therapeutic x 2 6/9 0348 HL = 0.56, therapeutic X 3   Goal of Therapy:  Heparin level 0.3-0.7 units/ml Monitor platelets by anticoagulation protocol: Yes   Plan:  6/9:  HL @ 0348 = 0.56 Will continue pt on current rate and recheck HL on 6/10 with  AM labs.    Valdemar Mcclenahan D 04/05/2021,4:53 AM

## 2021-04-17 ENCOUNTER — Encounter: Payer: Self-pay | Admitting: Emergency Medicine

## 2021-04-17 ENCOUNTER — Inpatient Hospital Stay
Admission: EM | Admit: 2021-04-17 | Discharge: 2021-04-20 | DRG: 301 | Disposition: A | Discharge status: Nursing Home (Includes ICF) | Payer: Medicare Other | Attending: Internal Medicine | Admitting: Internal Medicine

## 2021-04-17 ENCOUNTER — Emergency Department: Payer: Medicare Other

## 2021-04-17 ENCOUNTER — Other Ambulatory Visit: Payer: Self-pay

## 2021-04-17 DIAGNOSIS — Z7901 Long term (current) use of anticoagulants: Secondary | ICD-10-CM

## 2021-04-17 DIAGNOSIS — Z8249 Family history of ischemic heart disease and other diseases of the circulatory system: Secondary | ICD-10-CM

## 2021-04-17 DIAGNOSIS — D649 Anemia, unspecified: Secondary | ICD-10-CM | POA: Diagnosis present

## 2021-04-17 DIAGNOSIS — I10 Essential (primary) hypertension: Secondary | ICD-10-CM | POA: Diagnosis present

## 2021-04-17 DIAGNOSIS — Z86718 Personal history of other venous thrombosis and embolism: Secondary | ICD-10-CM

## 2021-04-17 DIAGNOSIS — Z90710 Acquired absence of both cervix and uterus: Secondary | ICD-10-CM

## 2021-04-17 DIAGNOSIS — Z66 Do not resuscitate: Secondary | ICD-10-CM | POA: Diagnosis present

## 2021-04-17 DIAGNOSIS — Z20822 Contact with and (suspected) exposure to covid-19: Secondary | ICD-10-CM | POA: Diagnosis present

## 2021-04-17 DIAGNOSIS — Z86711 Personal history of pulmonary embolism: Secondary | ICD-10-CM

## 2021-04-17 DIAGNOSIS — I82411 Acute embolism and thrombosis of right femoral vein: Principal | ICD-10-CM | POA: Diagnosis present

## 2021-04-17 DIAGNOSIS — M7989 Other specified soft tissue disorders: Secondary | ICD-10-CM | POA: Diagnosis not present

## 2021-04-17 DIAGNOSIS — I82811 Embolism and thrombosis of superficial veins of right lower extremities: Secondary | ICD-10-CM | POA: Diagnosis present

## 2021-04-17 DIAGNOSIS — I82401 Acute embolism and thrombosis of unspecified deep veins of right lower extremity: Secondary | ICD-10-CM

## 2021-04-17 DIAGNOSIS — J438 Other emphysema: Secondary | ICD-10-CM | POA: Diagnosis present

## 2021-04-17 DIAGNOSIS — J449 Chronic obstructive pulmonary disease, unspecified: Secondary | ICD-10-CM

## 2021-04-17 DIAGNOSIS — Z87891 Personal history of nicotine dependence: Secondary | ICD-10-CM

## 2021-04-17 DIAGNOSIS — Z881 Allergy status to other antibiotic agents status: Secondary | ICD-10-CM

## 2021-04-17 DIAGNOSIS — I82431 Acute embolism and thrombosis of right popliteal vein: Secondary | ICD-10-CM | POA: Diagnosis present

## 2021-04-17 DIAGNOSIS — I82461 Acute embolism and thrombosis of right calf muscular vein: Secondary | ICD-10-CM | POA: Diagnosis present

## 2021-04-17 DIAGNOSIS — Z9049 Acquired absence of other specified parts of digestive tract: Secondary | ICD-10-CM

## 2021-04-17 DIAGNOSIS — Z79899 Other long term (current) drug therapy: Secondary | ICD-10-CM

## 2021-04-17 DIAGNOSIS — B351 Tinea unguium: Secondary | ICD-10-CM | POA: Diagnosis present

## 2021-04-17 DIAGNOSIS — R531 Weakness: Secondary | ICD-10-CM

## 2021-04-17 LAB — COMPREHENSIVE METABOLIC PANEL
ALT: 7 U/L (ref 0–44)
AST: 13 U/L — ABNORMAL LOW (ref 15–41)
Albumin: 3.4 g/dL — ABNORMAL LOW (ref 3.5–5.0)
Alkaline Phosphatase: 80 U/L (ref 38–126)
Anion gap: 10 (ref 5–15)
BUN: 13 mg/dL (ref 8–23)
CO2: 29 mmol/L (ref 22–32)
Calcium: 8.7 mg/dL — ABNORMAL LOW (ref 8.9–10.3)
Chloride: 98 mmol/L (ref 98–111)
Creatinine, Ser: 0.77 mg/dL (ref 0.44–1.00)
GFR, Estimated: >60 mL/min (ref >60)
Glucose, Bld: 91 mg/dL (ref 70–99)
Potassium: 3.9 mmol/L (ref 3.5–5.1)
Sodium: 137 mmol/L (ref 135–145)
Total Bilirubin: 0.6 mg/dL (ref 0.3–1.2)
Total Protein: 7.3 g/dL (ref 6.5–8.1)

## 2021-04-17 LAB — APTT: aPTT: 66 seconds — ABNORMAL HIGH (ref 24–36)

## 2021-04-17 LAB — PROTIME-INR
INR: 1.6 — ABNORMAL HIGH (ref 0.8–1.2)
Prothrombin Time: 18.6 seconds — ABNORMAL HIGH (ref 11.4–15.2)

## 2021-04-17 NOTE — ED Notes (Signed)
Attempted 20g IV at L fa x1.

## 2021-04-17 NOTE — ED Triage Notes (Signed)
Pt to ED from [facility] BIB daughter c/o right leg pain.  Leg is swollen and painful.  Pt had thrombectomy on 6/7 to right leg.  Per daughter pt had US done and showed new clot from right ankle to knee.  Pt takes Eliquis.   Signature pad unable to reach pt.  Pt understands MSE waiver.

## 2021-04-17 NOTE — ED Notes (Signed)
Pt given warm blanket. Stretcher locked low. Rail up. Call bell within reach.  °

## 2021-04-17 NOTE — ED Notes (Signed)
Pt denies CP; denies SOB; R leg warm to touch, swollen, and mildly painful to pt. Pt can wiggle toes; dorsalis pedis pulse 1+; cap refill about 4 seconds. Pt's resp reg/unlabored; skin dry; calmly laying in bed. Visitor remains at bedside. Pt 96% RA.

## 2021-04-17 NOTE — ED Provider Notes (Signed)
[facility] Emergency Department Provider Note ____________________________________________   Event Date/Time   First MD Initiated Contact with Patient 04/17/21 2206     (approximate)  I have reviewed the triage vital signs and the nursing notes.  HISTORY  Chief Complaint Leg Swelling and Leg Pain  HPI Anne Harrison is a 87 y.o. female history of recent PE DVT thrombectomy  Patient presents today over the last few days not exactly sure when she noticed recurrent swelling in her right lower leg.  From about her calf down her leg is once again become swollen.  Notified doctor at her care facility and per the patient and family including daughter they found a new blood clot on ultrasound this evening.  Of note documentation of this test was not immediately available in the ER  Patient does not have any pain or discomfort.  They did notice some increasing swelling over the last few days and was also started on Lasix and potassium at that time  No chest pain no trouble breathing.  Normal state of health, had bruising and some swelling across the right leg that actually family reports is slowly improving but the swelling has worsened now from about the level of the knee down to the foot.  No numbness no pain in the foot.  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) 04/18/2021   Pulmonary embolism (HCC) 04/02/2021   DVT (deep venous thrombosis) (HCC)_right leg 04/02/2021   COVID-19 virus infection 04/02/2021   Other emphysema (HCC)    Onychomycosis 10/22/2017    Essential hypertension 10/07/2017    Past Surgical History:  Procedure Laterality Date   ABDOMINAL HYSTERECTOMY     CHOLECYSTECTOMY     PERIPHERAL VASCULAR THROMBECTOMY Right 04/03/2021   Procedure: PERIPHERAL VASCULAR THROMBECTOMY;  Surgeon: Renford Dills, MD;  Location: [facility] INVASIVE CV LAB;  Service: Cardiovascular;  Laterality: Right;    Prior to Admission medications   Medication Sig Start Date End Date Taking? Authorizing Provider  acetaminophen (TYLENOL) 325 MG tablet Take 650 mg by mouth 3 (three) times daily.   Yes [provider]  acetaminophen (TYLENOL) 325 MG tablet Take 325 mg by mouth every 4 (four) hours as needed for mild pain.   Yes [provider]  albuterol (VENTOLIN HFA) 108 (90 Base) MCG/ACT inhaler Inhale 1 puff into the lungs 2 (two) times daily.   Yes [provider]  amLODipine (NORVASC) 5 MG tablet Take 5 mg by mouth daily.    Yes [provider]  apixaban (ELIQUIS) 5 MG TABS tablet Take 2 tablets (10 mg total) by mouth 2 (two) times daily. 04/05/21  Yes Danford, Earl Lites, MD  ascorbic acid (VITAMIN C) 500 MG tablet Take 1,000 mg by mouth daily.   Yes [provider]  benzocaine (ORAJEL) 10 % mucosal gel Use as directed 1 application in the mouth or throat every 4 (four) hours as needed for mouth pain.   Yes [provider]  Calcium Carb-Cholecalciferol (OYSTER SHELL CALCIUM) 500-400 MG-UNIT TABS  Take 1 tablet by mouth daily.   Yes [provider]  furosemide (LASIX) 20 MG tablet Take 20 mg by mouth daily.   Yes [provider]  furosemide (LASIX) 40 MG tablet Take 40 mg by mouth daily.   Yes [provider]  metoprolol succinate (TOPROL-XL) 25 MG 24 hr tablet Take 25 mg by mouth at bedtime.    Yes [provider]  potassium chloride (KLOR-CON) 10 MEQ tablet Take 10 mEq by mouth daily.   Yes [provider]  potassium chloride SA (KLOR-CON) 20 MEQ tablet Take 20  mEq by mouth daily.   Yes [provider]    Allergies Ciprofloxacin  Family History  Problem Relation Age of Onset   Hypertension Mother    Heart attack Father     Social History Social History   Tobacco Use   Smoking status: Former    Pack years: 0.00   Smokeless tobacco: Never  Vaping Use   Vaping Use: Never used  Substance Use Topics   Alcohol use: Not Currently    Alcohol/week: 1.0 - 2.0 standard drink    Types: 1 - 2 Glasses of wine per week    Comment: 1-2 glasses of wine per day   Drug use: No    Review of Systems Constitutional: No fever/chills Eyes: No visual changes. ENT: No sore throat. Cardiovascular: Denies chest pain. Respiratory: Denies shortness of breath. Gastrointestinal: No abdominal pain.   Genitourinary: Negative for dysuria. Musculoskeletal: Right lower extremity swelling Skin: Negative for rash. Neurological: Negative for headaches, areas of focal weakness or numbness.    ____________________________________________   PHYSICAL EXAM:  VITAL SIGNS: ED Triage Vitals [04/17/21 2125]  Enc Vitals Group     BP (!) 188/91     Pulse Rate 64     Resp 16     Temp 97.8 F (36.6 C)     Temp Source Axillary     SpO2 95 %     Weight      Height      Head Circumference      Peak Flow      Pain Score      Pain Loc      Pain Edu?      Excl. in GC?     Constitutional: Alert and oriented. Well appearing and in no acute distress.  Patient and her daughter both very pleasant. Eyes: Conjunctivae are normal. Head: Atraumatic. Nose: No congestion/rhinnorhea. Mouth/Throat: Mucous membranes are moist. Neck: No stridor.  Cardiovascular: Normal rate, irregular rhythm. Grossly normal heart sounds.  Good peripheral circulation. Respiratory: Normal respiratory effort.  No retractions. Lungs CTAB. Gastrointestinal: Soft and nontender. No distention. Musculoskeletal: Left lower extremity no noted acute findings.  Right lower extremity  demonstrates moderate circumferential swelling from the level of the pelvis down to the ankle.  There is notable edema some tenderness to the right lower calf.  There is slight bruising across the right anterior tib-fib but family and patient reports this is actually improving and regressing.  She has dopplerable posterior tibial on the right foot with capillary refill approximately 1 second in all digits of the right foot.  There is no frank ischemia.  Able to wiggle the digits of the right foot well. Neurologic:  Normal speech and language. No gross focal neurologic deficits are appreciated.  Skin:  Skin is warm, dry and intact. No rash noted. Psychiatric: Mood and affect are normal. Speech and behavior are normal.  ____________________________________________   LABS (  all labs ordered are listed, but only abnormal results are displayed)  Labs Reviewed  COMPREHENSIVE METABOLIC PANEL - Abnormal; Notable for the following components:      Result Value   Calcium 8.7 (*)    Albumin 3.4 (*)    AST 13 (*)    All other components within normal limits  PROTIME-INR - Abnormal; Notable for the following components:   Prothrombin Time 18.6 (*)    INR 1.6 (*)    All other components within normal limits  APTT - Abnormal; Notable for the following components:   aPTT 66 (*)    All other components within normal limits  CBC WITH DIFFERENTIAL/PLATELET - Abnormal; Notable for the following components:   RBC 3.77 (*)    Hemoglobin 11.7 (*)    HCT 35.4 (*)    All other components within normal limits  RESP PANEL BY RT-PCR (FLU A&B, COVID) ARPGX2  HEPARIN LEVEL (UNFRACTIONATED)   ____________________________________________  EKG   ____________________________________________  RADIOLOGY  US Venous Img Lower Unilateral Right  Result Date: 04/18/2021 CLINICAL DATA:  Right lower extremity swelling EXAM: RIGHT LOWER EXTREMITY VENOUS DOPPLER ULTRASOUND TECHNIQUE: Gray-scale sonography with graded  compression, as well as color Doppler and duplex ultrasound were performed to evaluate the lower extremity deep venous systems from the level of the common femoral vein and including the common femoral, femoral, profunda femoral, popliteal and calf veins including the posterior tibial, peroneal and gastrocnemius veins when visible. The superficial great saphenous vein was also interrogated. Spectral Doppler was utilized to evaluate flow at rest and with distal augmentation maneuvers in the common femoral, femoral and popliteal veins. COMPARISON:  04/02/2021 FINDINGS: Contralateral Common Femoral Vein: Respiratory phasicity is normal and symmetric with the symptomatic side. No evidence of thrombus. Normal compressibility. Common Femoral Vein: Thrombus is noted with decreased compressibility. Saphenofemoral Junction: Thrombus is noted with decreased compressibility. Profunda Femoral Vein: Thrombus is noted with decreased compressibility. Femoral Vein: Thrombus is noted with decreased compressibility. Popliteal Vein: Thrombus is noted with decreased compressibility. Calf Veins: Thrombus is noted with decreased compressibility. Superficial Great Saphenous Vein: Thrombus is noted with decreased compressibility from the mid thigh to the saphenofemoral junction. Venous Reflux:  None. Other Findings:  None. IMPRESSION: Diffuse deep venous thrombosis in the right leg similar to that seen on prior exam. Electronically Signed   By: Alcide Clever M.D.   On: 04/18/2021 00:00      Discussed with ultrasound technician, preliminary report shows extensive DVT down the right lower extremity ____________________________________________   PROCEDURES  Procedure(s) performed: None  Procedures  Critical Care performed: No  ____________________________________________   INITIAL IMPRESSION / ASSESSMENT AND PLAN / ED COURSE  Pertinent labs & imaging results that were available during my care of the patient were reviewed  by me and considered in my medical decision making (see chart for details).   Patient presents with recurrent swelling of the right lower extremity after recent thrombectomy and lysis of extensive DVTs.  Review of nursing records do demonstrate that she has been compliant with Eliquis therapy.  She has findings concerning for preocclusive event possible acute DVT the right lower extremity.  Discussed her case and clinical presentation with Dr. Gilda Crease of vascular surgery at 10:30 PM.  He recommends that though she is on Eliquis at this time he would bridge her back onto heparin therapy, will provide vascular consult tomorrow.  Heparin for now.  She does not have evidence of acute vascular arterial ischemia involving the right lower extremity,  but does have evidence of venous occlusion once again.  Discussed with both the patient and her family, they are understanding agreeable with plan for admission vascular consult.    ----------------------------------------- 11:55 PM on 04/17/2021 ----------------------------------------- Labs are reviewed to this point, noted slight low calcium.  INR 1.6.   Ultrasound reveals extensive right lower extremity DVT.  Case was discussed previously with vascular surgery patient will be admitted on heparin infusion.  Admission request discussed with and accepted by Dr. Arville CareMansy, full vascular consult to follow  Patient and family understand ____________________________________________   FINAL CLINICAL IMPRESSION(S) / ED DIAGNOSES  Final diagnoses:  Acute deep vein thrombosis (DVT) of right lower extremity, unspecified vein (HCC)        Note:  This document was prepared using Dragon voice recognition software and may include unintentional dictation errors       Sharyn CreamerQuale, Mistee Soliman, MD 04/18/21 0008

## 2021-04-18 ENCOUNTER — Other Ambulatory Visit (INDEPENDENT_AMBULATORY_CARE_PROVIDER_SITE_OTHER): Payer: Self-pay | Admitting: Vascular Surgery

## 2021-04-18 ENCOUNTER — Encounter
Admission: EM | Disposition: A | Discharge status: Nursing Home (Includes ICF) | Payer: Self-pay | Source: Home / Self Care | Attending: Internal Medicine

## 2021-04-18 DIAGNOSIS — J439 Emphysema, unspecified: Secondary | ICD-10-CM | POA: Diagnosis not present

## 2021-04-18 DIAGNOSIS — Z20822 Contact with and (suspected) exposure to covid-19: Secondary | ICD-10-CM | POA: Diagnosis present

## 2021-04-18 DIAGNOSIS — M7989 Other specified soft tissue disorders: Secondary | ICD-10-CM | POA: Diagnosis present

## 2021-04-18 DIAGNOSIS — Z7901 Long term (current) use of anticoagulants: Secondary | ICD-10-CM | POA: Diagnosis not present

## 2021-04-18 DIAGNOSIS — I82461 Acute embolism and thrombosis of right calf muscular vein: Secondary | ICD-10-CM | POA: Diagnosis present

## 2021-04-18 DIAGNOSIS — D649 Anemia, unspecified: Secondary | ICD-10-CM | POA: Diagnosis present

## 2021-04-18 DIAGNOSIS — R531 Weakness: Secondary | ICD-10-CM | POA: Diagnosis not present

## 2021-04-18 DIAGNOSIS — Z881 Allergy status to other antibiotic agents status: Secondary | ICD-10-CM | POA: Diagnosis not present

## 2021-04-18 DIAGNOSIS — I82409 Acute embolism and thrombosis of unspecified deep veins of unspecified lower extremity: Secondary | ICD-10-CM | POA: Diagnosis not present

## 2021-04-18 DIAGNOSIS — I82411 Acute embolism and thrombosis of right femoral vein: Secondary | ICD-10-CM | POA: Diagnosis present

## 2021-04-18 DIAGNOSIS — J449 Chronic obstructive pulmonary disease, unspecified: Secondary | ICD-10-CM | POA: Diagnosis not present

## 2021-04-18 DIAGNOSIS — I82401 Acute embolism and thrombosis of unspecified deep veins of right lower extremity: Secondary | ICD-10-CM | POA: Diagnosis not present

## 2021-04-18 DIAGNOSIS — Z86718 Personal history of other venous thrombosis and embolism: Secondary | ICD-10-CM | POA: Diagnosis not present

## 2021-04-18 DIAGNOSIS — J438 Other emphysema: Secondary | ICD-10-CM | POA: Diagnosis present

## 2021-04-18 DIAGNOSIS — Z90710 Acquired absence of both cervix and uterus: Secondary | ICD-10-CM | POA: Diagnosis not present

## 2021-04-18 DIAGNOSIS — I10 Essential (primary) hypertension: Secondary | ICD-10-CM

## 2021-04-18 DIAGNOSIS — Z9049 Acquired absence of other specified parts of digestive tract: Secondary | ICD-10-CM | POA: Diagnosis not present

## 2021-04-18 DIAGNOSIS — I82431 Acute embolism and thrombosis of right popliteal vein: Secondary | ICD-10-CM | POA: Diagnosis present

## 2021-04-18 DIAGNOSIS — Z8249 Family history of ischemic heart disease and other diseases of the circulatory system: Secondary | ICD-10-CM | POA: Diagnosis not present

## 2021-04-18 DIAGNOSIS — Z79899 Other long term (current) drug therapy: Secondary | ICD-10-CM | POA: Diagnosis not present

## 2021-04-18 DIAGNOSIS — B351 Tinea unguium: Secondary | ICD-10-CM | POA: Diagnosis present

## 2021-04-18 DIAGNOSIS — I82811 Embolism and thrombosis of superficial veins of right lower extremities: Secondary | ICD-10-CM | POA: Diagnosis present

## 2021-04-18 DIAGNOSIS — Z86711 Personal history of pulmonary embolism: Secondary | ICD-10-CM | POA: Diagnosis not present

## 2021-04-18 DIAGNOSIS — Z87891 Personal history of nicotine dependence: Secondary | ICD-10-CM | POA: Diagnosis not present

## 2021-04-18 DIAGNOSIS — Z66 Do not resuscitate: Secondary | ICD-10-CM | POA: Diagnosis present

## 2021-04-18 HISTORY — PX: IVC FILTER INSERTION: CATH118245

## 2021-04-18 LAB — CBC WITH DIFFERENTIAL/PLATELET
Abs Immature Granulocytes: 0.05 10*3/uL (ref 0.00–0.07)
Basophils Absolute: 0.1 10*3/uL (ref 0.0–0.1)
Basophils Relative: 1 %
Eosinophils Absolute: 0.1 10*3/uL (ref 0.0–0.5)
Eosinophils Relative: 1 %
HCT: 35.4 % — ABNORMAL LOW (ref 36.0–46.0)
Hemoglobin: 11.7 g/dL — ABNORMAL LOW (ref 12.0–15.0)
Immature Granulocytes: 1 %
Lymphocytes Relative: 25 %
Lymphs Abs: 1.8 10*3/uL (ref 0.7–4.0)
MCH: 31 pg (ref 26.0–34.0)
MCHC: 33.1 g/dL (ref 30.0–36.0)
MCV: 93.9 fL (ref 80.0–100.0)
Monocytes Absolute: 1 10*3/uL (ref 0.1–1.0)
Monocytes Relative: 14 %
Neutro Abs: 4.4 10*3/uL (ref 1.7–7.7)
Neutrophils Relative %: 58 %
Platelets: 183 10*3/uL (ref 150–400)
RBC: 3.77 MIL/uL — ABNORMAL LOW (ref 3.87–5.11)
RDW: 14.8 % (ref 11.5–15.5)
WBC: 7.4 10*3/uL (ref 4.0–10.5)
nRBC: 0 % (ref 0.0–0.2)

## 2021-04-18 LAB — CBC
HCT: 33.6 % — ABNORMAL LOW (ref 36.0–46.0)
Hemoglobin: 10.9 g/dL — ABNORMAL LOW (ref 12.0–15.0)
MCH: 30.4 pg (ref 26.0–34.0)
MCHC: 32.4 g/dL (ref 30.0–36.0)
MCV: 93.9 fL (ref 80.0–100.0)
Platelets: 179 10*3/uL (ref 150–400)
RBC: 3.58 MIL/uL — ABNORMAL LOW (ref 3.87–5.11)
RDW: 14.6 % (ref 11.5–15.5)
WBC: 7.1 10*3/uL (ref 4.0–10.5)
nRBC: 0 % (ref 0.0–0.2)

## 2021-04-18 LAB — BASIC METABOLIC PANEL
Anion gap: 7 (ref 5–15)
BUN: 11 mg/dL (ref 8–23)
CO2: 31 mmol/L (ref 22–32)
Calcium: 8.8 mg/dL — ABNORMAL LOW (ref 8.9–10.3)
Chloride: 103 mmol/L (ref 98–111)
Creatinine, Ser: 0.81 mg/dL (ref 0.44–1.00)
GFR, Estimated: >60 mL/min (ref >60)
Glucose, Bld: 80 mg/dL (ref 70–99)
Potassium: 4 mmol/L (ref 3.5–5.1)
Sodium: 141 mmol/L (ref 135–145)

## 2021-04-18 LAB — RESP PANEL BY RT-PCR (FLU A&B, COVID) ARPGX2
Influenza A by PCR: NEGATIVE
Influenza B by PCR: NEGATIVE
SARS Coronavirus 2 by RT PCR: NEGATIVE

## 2021-04-18 LAB — HEPARIN LEVEL (UNFRACTIONATED)
Heparin Unfractionated: >1.1 IU/mL — ABNORMAL HIGH (ref 0.30–0.70)
Heparin Unfractionated: >1.1 IU/mL — ABNORMAL HIGH (ref 0.30–0.70)

## 2021-04-18 LAB — APTT: aPTT: 76 seconds — ABNORMAL HIGH (ref 24–36)

## 2021-04-18 SURGERY — IVC FILTER INSERTION
Anesthesia: Moderate Sedation

## 2021-04-18 MED ORDER — MAGNESIUM HYDROXIDE 400 MG/5ML PO SUSP: 30.0000 mL | Freq: Every day | ORAL | Status: DC | PRN

## 2021-04-18 MED ORDER — FUROSEMIDE 40 MG PO TABS: 20.0000 mg | ORAL_TABLET | Freq: Every day | ORAL | Status: DC

## 2021-04-18 MED ORDER — METHYLPREDNISOLONE SODIUM SUCC 125 MG IJ SOLR: 125.0000 mg | Freq: Once | INTRAMUSCULAR | Status: DC | PRN

## 2021-04-18 MED ORDER — DIPHENHYDRAMINE HCL 50 MG/ML IJ SOLN: 50.0000 mg | Freq: Once | INTRAMUSCULAR | Status: DC | PRN

## 2021-04-18 MED ORDER — ACETAMINOPHEN 650 MG RE SUPP: 650.0000 mg | Freq: Four times a day (QID) | RECTAL | Status: DC | PRN

## 2021-04-18 MED ORDER — SODIUM CHLORIDE 0.9 % IV SOLN: 250.0000 mL | INTRAVENOUS | Status: DC | PRN

## 2021-04-18 MED ORDER — AMLODIPINE BESYLATE 5 MG PO TABS
5.0000 mg | ORAL_TABLET | Freq: Every day | ORAL | Status: DC
  Administered 2021-04-18 – 2021-04-20 (×3): 5 mg via ORAL
  Filled 2021-04-18 (×2): qty 1

## 2021-04-18 MED ORDER — CALCIUM CARBONATE-VITAMIN D 500-200 MG-UNIT PO TABS
1.0000 | ORAL_TABLET | Freq: Every day | ORAL | Status: DC
  Administered 2021-04-19 – 2021-04-20 (×2): 1 via ORAL
  Filled 2021-04-18 (×2): qty 1

## 2021-04-18 MED ORDER — ACETAMINOPHEN 325 MG PO TABS: 650.0000 mg | ORAL_TABLET | Freq: Four times a day (QID) | ORAL | Status: DC | PRN

## 2021-04-18 MED ORDER — AMLODIPINE BESYLATE 5 MG PO TABS
ORAL_TABLET | ORAL | Status: AC
  Filled 2021-04-18: qty 1

## 2021-04-18 MED ORDER — SODIUM CHLORIDE 0.9 % IV SOLN: INTRAVENOUS | Status: DC

## 2021-04-18 MED ORDER — SODIUM CHLORIDE 0.9% FLUSH
3.0000 mL | Freq: Two times a day (BID) | INTRAVENOUS | Status: DC
  Administered 2021-04-20 (×2): 3 mL via INTRAVENOUS

## 2021-04-18 MED ORDER — IODIXANOL 320 MG/ML IV SOLN
INTRAVENOUS | Status: DC | PRN
  Administered 2021-04-18: 10 mL

## 2021-04-18 MED ORDER — MIDAZOLAM HCL 2 MG/2ML IJ SOLN
INTRAMUSCULAR | Status: AC
  Filled 2021-04-18: qty 2

## 2021-04-18 MED ORDER — TRAZODONE HCL 50 MG PO TABS: 25.0000 mg | ORAL_TABLET | Freq: Every evening | ORAL | Status: DC | PRN

## 2021-04-18 MED ORDER — ONDANSETRON HCL 4 MG/2ML IJ SOLN: 4.0000 mg | Freq: Four times a day (QID) | INTRAMUSCULAR | Status: DC | PRN

## 2021-04-18 MED ORDER — FUROSEMIDE 40 MG PO TABS
40.0000 mg | ORAL_TABLET | Freq: Every day | ORAL | Status: DC
  Administered 2021-04-19 – 2021-04-20 (×2): 40 mg via ORAL
  Filled 2021-04-18 (×2): qty 1

## 2021-04-18 MED ORDER — POTASSIUM CHLORIDE CRYS ER 20 MEQ PO TBCR
20.0000 meq | EXTENDED_RELEASE_TABLET | Freq: Every day | ORAL | Status: DC
  Administered 2021-04-19 – 2021-04-20 (×2): 20 meq via ORAL
  Filled 2021-04-18 (×2): qty 1

## 2021-04-18 MED ORDER — FENTANYL CITRATE (PF) 100 MCG/2ML IJ SOLN
INTRAMUSCULAR | Status: DC | PRN
  Administered 2021-04-18: 25 ug via INTRAVENOUS

## 2021-04-18 MED ORDER — HEPARIN (PORCINE) 25000 UT/250ML-% IV SOLN
600.0000 [IU]/h | INTRAVENOUS | Status: DC
  Administered 2021-04-18: 600 [IU]/h via INTRAVENOUS

## 2021-04-18 MED ORDER — POTASSIUM CHLORIDE CRYS ER 20 MEQ PO TBCR: 10.0000 meq | EXTENDED_RELEASE_TABLET | Freq: Every day | ORAL | Status: DC

## 2021-04-18 MED ORDER — MIDAZOLAM HCL 2 MG/ML PO SYRP
8.0000 mg | ORAL_SOLUTION | Freq: Once | ORAL | Status: DC | PRN
  Filled 2021-04-18: qty 4

## 2021-04-18 MED ORDER — ASCORBIC ACID 500 MG PO TABS
1000.0000 mg | ORAL_TABLET | Freq: Every day | ORAL | Status: DC
  Administered 2021-04-19 – 2021-04-20 (×2): 1000 mg via ORAL
  Filled 2021-04-18 (×2): qty 2

## 2021-04-18 MED ORDER — APIXABAN 5 MG PO TABS: 10.0000 mg | ORAL_TABLET | Freq: Two times a day (BID) | ORAL | Status: DC

## 2021-04-18 MED ORDER — MIDAZOLAM HCL 2 MG/2ML IJ SOLN
INTRAMUSCULAR | Status: DC | PRN
  Administered 2021-04-18: 0.5 mg via INTRAVENOUS

## 2021-04-18 MED ORDER — HEPARIN (PORCINE) 25000 UT/250ML-% IV SOLN
600.0000 [IU]/h | INTRAVENOUS | Status: DC
  Administered 2021-04-18: 600 [IU]/h via INTRAVENOUS
  Filled 2021-04-18: qty 250

## 2021-04-18 MED ORDER — ONDANSETRON HCL 4 MG PO TABS: 4.0000 mg | ORAL_TABLET | Freq: Four times a day (QID) | ORAL | Status: DC | PRN

## 2021-04-18 MED ORDER — SODIUM CHLORIDE 0.9% FLUSH: 3.0000 mL | INTRAVENOUS | Status: DC | PRN

## 2021-04-18 MED ORDER — ALBUTEROL SULFATE (2.5 MG/3ML) 0.083% IN NEBU
2.5000 mg | INHALATION_SOLUTION | Freq: Two times a day (BID) | RESPIRATORY_TRACT | Status: DC
  Filled 2021-04-18: qty 3

## 2021-04-18 MED ORDER — FENTANYL CITRATE (PF) 100 MCG/2ML IJ SOLN
INTRAMUSCULAR | Status: AC
  Filled 2021-04-18: qty 2

## 2021-04-18 MED ORDER — CEFAZOLIN SODIUM-DEXTROSE 2-4 GM/100ML-% IV SOLN
2.0000 g | Freq: Once | INTRAVENOUS | Status: DC
  Administered 2021-04-18: 2 g via INTRAVENOUS
  Filled 2021-04-18: qty 100

## 2021-04-18 MED ORDER — FAMOTIDINE 20 MG PO TABS: 40.0000 mg | ORAL_TABLET | Freq: Once | ORAL | Status: DC | PRN

## 2021-04-18 MED ORDER — HYDROMORPHONE HCL 1 MG/ML IJ SOLN
1.0000 mg | Freq: Once | INTRAMUSCULAR | Status: DC | PRN
Start: 2021-04-18 — End: 2021-04-20

## 2021-04-18 MED ORDER — METOPROLOL SUCCINATE ER 25 MG PO TB24
25.0000 mg | ORAL_TABLET | Freq: Every day | ORAL | Status: DC
  Administered 2021-04-18 – 2021-04-19 (×3): 25 mg via ORAL
  Filled 2021-04-18 (×3): qty 1

## 2021-04-18 SURGICAL SUPPLY — 4 items
COVER PROBE U/S 5X48 (MISCELLANEOUS) ×2 IMPLANT
KIT FEMORAL DEL DENALI (Miscellaneous) ×2 IMPLANT
PACK ANGIOGRAPHY (CUSTOM PROCEDURE TRAY) ×2 IMPLANT
WIRE GUIDERIGHT .035X150 (WIRE) ×2 IMPLANT

## 2021-04-18 NOTE — Progress Notes (Signed)
[facility] Daily Progress Note   04/03/21: 1.   US guidance for vascular access to right popliteal vein 2.   Catheter placement into right common iliac vein from right popliteal approach 3.   IVC gram and right lower extremity venogram 4.   Catheter directed thrombolysis with 8 mg of tPA to the superficial femoral, common femoral external iliac and common iliac veins 5.   Mechanical thrombectomy of the right superficial femoral common femoral external iliac and common iliac veins 6.   PTA of right superficial femoral common femoral external iliac and common iliac veins with 8 mm x 100 mm [place] balloon  Subjective: Leahann Lempke is a 87 y.o. Caucasian female with medical history significant for essential hypertension, emphysema and recent right lower extremity DVT status post thrombectomy on 6/7 after which she was discharged on 6/9 to [facility].  She has been doing fairly well for 5 days until she started having leg swelling about a week ago.  She was given Lasix and potassium.  The swelling has not been getting better.  She had an venous Doppler yesterday that showed increased clot burden for which she was referred to the emergency room.  She denies any fever or chills.  No chest pain or palpitations.  No cough or wheezing or hemoptysis.  No nausea or vomiting or abdominal pain.  No bleeding diathesis.  She has been taking her Eliquis regularly.  Objective: Vitals:   04/18/21 0530 04/18/21 0600 04/18/21 0630 04/18/21 0800  BP: (!) 130/55 (!) 147/62 (!) 139/51 (!) 144/51  Pulse: (!) 52 (!) 57 (!) 51 (!) 55  Resp:  16 16 16   Temp:    98.2 F (36.8 C)  TempSrc:    Oral  SpO2: 99% 94% 95% 94%  Weight:      Height:        Intake/Output Summary (Last 24 hours) at 04/18/2021 1244 Last data filed at 04/18/2021 04/20/2021 Gross per 24 hour  Intake 49.88 ml  Output --  Net 49.88 ml   Physical Exam: A&Ox3, NAD CV: RRR Pulmonary: CTA Bilaterally Abdomen: Soft,  Nontender, Nondistended Vascular:  Right lower extremity: Thigh soft.  Calf is soft but edematous.  There is no acute vascular compromise noted to the extremity at this time.  Hard to palpate pedal pulses due to edema however the foot is warm.  Good capillary refill.  Motor/sensory is intact.  Nontender to palpation.   Laboratory: CBC    Component Value Date/Time   WBC 7.1 04/18/2021 0402   HGB 10.9 (L) 04/18/2021 0402   HCT 33.6 (L) 04/18/2021 0402   PLT 179 04/18/2021 0402   BMET    Component Value Date/Time   NA 141 04/18/2021 0402   K 4.0 04/18/2021 0402   CL 103 04/18/2021 0402   CO2 31 04/18/2021 0402   GLUCOSE 80 04/18/2021 0402   BUN 11 04/18/2021 0402   CREATININE 0.81 04/18/2021 0402   CALCIUM 8.8 (L) 04/18/2021 0402   GFRNONAA >60 04/18/2021 0402   GFRAA >60 07/11/2020 1245   Assessment/Planning: The patient is a 87 year old female with multiple medical issues with a past medical history of extensive DVT to the right lower extremity status post thrombolysis/thrombectomy who now presents with recurrent DVT in the same leg.  1) recurrent DVT: Patient / daughter states to be taking her Eliquis compliantly.  Found to have recurrent extensive DVT to the right lower extremity.  Most recent intervention approximately 2 weeks ago.  Patient did poorly with this and now presents with recurrent DVT.  Do not recommend repeating thrombolysis / thrombectomy however recommend placing IVC filter and attempt to decrease the patient's risk for pulmonary embolism.  No acute vascular compromise noted to the right lower extremity on exam today.  Procedure, risk and benefits were explained to the patient.  All questions answered.  The patient wishes to proceed.  Discussed with Dr. Charlie Pitter Brunilda Eble PA-C 04/18/2021 12:44 PM

## 2021-04-18 NOTE — H&P (Signed)
[place]   PATIENT NAME: Anne Harrison    MR#:  [id number]  DATE OF BIRTH:  1924/05/30  DATE OF ADMISSION:  04/17/2021  PRIMARY CARE PHYSICIAN: [facility]   Patient is coming from: Home  REQUESTING/REFERRING PHYSICIAN: Sharyn Creamer,  CHIEF COMPLAINT:   Chief Complaint  Patient presents with   Leg Swelling   Leg Pain    HISTORY OF PRESENT ILLNESS:  Anne Harrison is a 87 y.o. Caucasian female with medical history significant for essential hypertension, emphysema and recent right lower extremity DVT status post thrombectomy on 6/7 after which she was discharged on 6/9 to [facility].  She has been doing fairly well for 5 days until she started having leg swelling about a week ago.  She was given Lasix and potassium.  The swelling has not been getting better.  She had an venous Doppler yesterday that showed increased clot burden for which she was referred to the emergency room.  She denies any fever or chills.  No chest pain or palpitations.  No cough or wheezing or hemoptysis.  No nausea or vomiting or abdominal pain.  No bleeding diathesis.  She has been taking her Eliquis regularly.  ED Course: When she came to the ER heart rate was 54 and later 56 with otherwise normal vital signs.  Labs revealed an albumin of 3.4 with total protein of 7.3 with otherwise unremarkable CMP.  CBC showed anemia better than previous levels.  INR was 1.6 and PT 18.6 with PTT of 66.  Influenza antigens and COVID-19 PCR came back negative.  Imaging: Venous Doppler of the right lower extremity revealed the following: Diffuse deep venous thrombosis in the right leg similar to that seen on prior exam extending from the calf veins to the common femoral vein.  The patient was started on IV heparin after consultation with Dr. Marena Chancy.  She will be admitted to a medical bed for further evaluation and management.  PAST MEDICAL HISTORY:   Past Medical History:  Diagnosis Date    Bronchitis, not specified as acute or chronic    Essential (primary) hypertension    Other emphysema (HCC)    Tinea unguium    Urge incontinence   Right lower extremity diffuse DVT.  PAST SURGICAL HISTORY:   Past Surgical History:  Procedure Laterality Date   ABDOMINAL HYSTERECTOMY     CHOLECYSTECTOMY     PERIPHERAL VASCULAR THROMBECTOMY Right 04/03/2021   Procedure: PERIPHERAL VASCULAR THROMBECTOMY;  Surgeon: Renford Dills, MD;  Location: [facility] INVASIVE CV LAB;  Service: Cardiovascular;  Laterality: Right;    SOCIAL HISTORY:   Social History   Tobacco Use   Smoking status: Former    Pack years: 0.00   Smokeless tobacco: Never  Substance Use Topics   Alcohol use: Not Currently    Alcohol/week: 1.0 - 2.0 standard drink    Types: 1 - 2 Glasses of wine per week    Comment: 1-2 glasses of wine per day    FAMILY HISTORY:   Family History  Problem Relation Age of Onset   Hypertension Mother    Heart attack Father     DRUG ALLERGIES:   Allergies  Allergen Reactions   Ciprofloxacin Hives    REVIEW OF SYSTEMS:   ROS As per history of present illness. All pertinent systems were reviewed above. Constitutional, HEENT, cardiovascular, respiratory, GI, GU, musculoskeletal, neuro, psychiatric, endocrine, integumentary and hematologic systems were reviewed and are otherwise negative/unremarkable except for positive findings  mentioned above in the HPI.   MEDICATIONS AT HOME:   Prior to Admission medications   Medication Sig Start Date End Date Taking? Authorizing Provider  acetaminophen (TYLENOL) 325 MG tablet Take 650 mg by mouth 3 (three) times daily.   Yes [provider]  acetaminophen (TYLENOL) 325 MG tablet Take 325 mg by mouth every 4 (four) hours as needed for mild pain.   Yes [provider]  albuterol (VENTOLIN HFA) 108 (90 Base) MCG/ACT inhaler Inhale 1 puff into the lungs 2 (two) times daily.   Yes [provider]  amLODipine  (NORVASC) 5 MG tablet Take 5 mg by mouth daily.    Yes [provider]  apixaban (ELIQUIS) 5 MG TABS tablet Take 2 tablets (10 mg total) by mouth 2 (two) times daily. 04/05/21  Yes Danford, Earl Lites, MD  ascorbic acid (VITAMIN C) 500 MG tablet Take 1,000 mg by mouth daily.   Yes [provider]  benzocaine (ORAJEL) 10 % mucosal gel Use as directed 1 application in the mouth or throat every 4 (four) hours as needed for mouth pain.   Yes [provider]  Calcium Carb-Cholecalciferol (OYSTER SHELL CALCIUM) 500-400 MG-UNIT TABS Take 1 tablet by mouth daily.   Yes [provider]  furosemide (LASIX) 20 MG tablet Take 20 mg by mouth daily.   Yes [provider]  furosemide (LASIX) 40 MG tablet Take 40 mg by mouth daily.   Yes [provider]  metoprolol succinate (TOPROL-XL) 25 MG 24 hr tablet Take 25 mg by mouth at bedtime.    Yes [provider]  potassium chloride (KLOR-CON) 10 MEQ tablet Take 10 mEq by mouth daily.   Yes [provider]  potassium chloride SA (KLOR-CON) 20 MEQ tablet Take 20 mEq by mouth daily.   Yes [provider]      VITAL SIGNS:  Blood pressure 131/64, pulse (!) 51, temperature 97.8 F (36.6 C), temperature source Axillary, resp. rate 15, height 4\' 11"  (1.499 m), weight 52 kg, SpO2 100 %.  PHYSICAL EXAMINATION:  Physical Exam  GENERAL:  87 y.o.-year-old Caucasian female patient lying in the bed with no acute distress.  EYES: Pupils equal, round, reactive to light and accommodation. No scleral icterus. Extraocular muscles intact.  HEENT: Head atraumatic, normocephalic. Oropharynx and nasopharynx clear.  NECK:  Supple, no jugular venous distention. No thyroid enlargement, no tenderness.  LUNGS: Normal breath sounds bilaterally, no wheezing, rales,rhonchi or crepitation. No use of accessory muscles of respiration.  CARDIOVASCULAR: Regular rate and rhythm, S1, S2 normal. No murmurs, rubs, or  gallops.  ABDOMEN: Soft, nondistended, nontender. Bowel sounds present. No organomegaly or mass.  EXTREMITIES: Right leg swelling with mild erythema and minimal tenderness extending from the ankle throughout her leg up to her knee.  No clubbing or cyanosis. NEUROLOGIC: Cranial nerves II through XII are intact. Muscle strength 5/5 in all extremities. Sensation intact. Gait not checked.  PSYCHIATRIC: The patient is alert and oriented x 3.  Normal affect and good eye contact. SKIN: No obvious rash, lesion, or ulcer.   LABORATORY PANEL:   CBC Recent Labs  Lab 04/17/21 2234  WBC 7.4  HGB 11.7*  HCT 35.4*  PLT 183   ------------------------------------------------------------------------------------------------------------------  Chemistries  Recent Labs  Lab 04/17/21 2234  NA 137  K 3.9  CL 98  CO2 29  GLUCOSE 91  BUN 13  CREATININE 0.77  CALCIUM 8.7*  AST 13*  ALT 7  ALKPHOS 80  BILITOT 0.6   ------------------------------------------------------------------------------------------------------------------  Cardiac Enzymes No results for input(s): TROPONINI in the last 168 hours. ------------------------------------------------------------------------------------------------------------------  RADIOLOGY:  US Venous Img Lower Unilateral Right  Result Date: 04/18/2021 CLINICAL DATA:  Right lower extremity swelling EXAM: RIGHT LOWER EXTREMITY VENOUS DOPPLER ULTRASOUND TECHNIQUE: Gray-scale sonography with graded compression, as well as color Doppler and duplex ultrasound were performed to evaluate the lower extremity deep venous systems from the level of the common femoral vein and including the common femoral, femoral, profunda femoral, popliteal and calf veins including the posterior tibial, peroneal and gastrocnemius veins when visible. The superficial great saphenous vein was also interrogated. Spectral Doppler was utilized to evaluate flow at rest and with distal  augmentation maneuvers in the common femoral, femoral and popliteal veins. COMPARISON:  04/02/2021 FINDINGS: Contralateral Common Femoral Vein: Respiratory phasicity is normal and symmetric with the symptomatic side. No evidence of thrombus. Normal compressibility. Common Femoral Vein: Thrombus is noted with decreased compressibility. Saphenofemoral Junction: Thrombus is noted with decreased compressibility. Profunda Femoral Vein: Thrombus is noted with decreased compressibility. Femoral Vein: Thrombus is noted with decreased compressibility. Popliteal Vein: Thrombus is noted with decreased compressibility. Calf Veins: Thrombus is noted with decreased compressibility. Superficial Great Saphenous Vein: Thrombus is noted with decreased compressibility from the mid thigh to the saphenofemoral junction. Venous Reflux:  None. Other Findings:  None. IMPRESSION: Diffuse deep venous thrombosis in the right leg similar to that seen on prior exam. Electronically Signed   By: Alcide Clever M.D.   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  Recurrent acute DVT of the right lower extremity. - Patient will be admitted to a medical bed. - We will place her on IV heparin and hold off Eliquis. - Vascular surgery consultation will be obtained. - Dr. Gilda Crease was notified about the patient. - Pain management to be provided. - The patient will need to be evaluated for hypercoagulopathy after resolution of her acute phase.  2.  Essential hypertension. - We will continue amlodipine and Toprol-XL.  3.  COPD/emphysema.  We will place her on as needed albuterol.  DVT prophylaxis: IV heparin.   Code Status: The patient is DNR/DNI. Family Communication:  The plan of care was discussed in details with the patient (and her daughter who was with her in the room.). I answered all questions. The patient agreed to proceed with  the above mentioned plan. Further management will depend upon hospital course. Disposition Plan: Back to previous home environment Consults called: Back surgery consult.   All the records are reviewed and case discussed with ED provider.  Status is: Inpatient  Remains inpatient appropriate because:Ongoing active pain requiring inpatient pain management, Ongoing diagnostic testing needed not appropriate for outpatient work up, Unsafe d/c plan, IV treatments appropriate due to intensity of illness or inability to take PO, and Inpatient level of care appropriate due to severity of illness  Dispo: The patient is from: SNF              Anticipated d/c is to: SNF              Patient currently is not medically stable to d/c.   Difficult to place patient No   TOTAL TIME TAKING CARE OF THIS PATIENT: 55 minutes.    Hannah Beat M.D on 04/18/2021 at 1:00 AM  [facility]   From 7 PM-7 AM, contact night-coverage [email or web address]  CC: Primary  care physician; [facility]

## 2021-04-18 NOTE — Progress Notes (Signed)
ANTICOAGULATION CONSULT NOTE - Initial Consult  Pharmacy Consult for Heparin Indication: VTE treatment  Allergies  Allergen Reactions   Ciprofloxacin Hives    Patient Measurements: Height: 4\' 11"  (149.9 cm) Weight: 52 kg (114 lb 10.2 oz) IBW/kg (Calculated) : 43.2 Heparin Dosing Weight: 52 kg   Vital Signs: Temp: 98.2 F (36.8 C) (06/22 0800) Temp Source: Oral (06/22 0800) BP: 144/51 (06/22 0800) Pulse Rate: 55 (06/22 0800)  Labs: Recent Labs    04/17/21 2234 04/18/21 0402 04/18/21 0831  HGB 11.7* 10.9*  --   HCT 35.4* 33.6*  --   PLT 183 179  --   APTT 66*  --  76*  LABPROT 18.6*  --   --   INR 1.6*  --   --   HEPARINUNFRC >1.10*  --  >1.10*  CREATININE 0.77 0.81  --      Estimated Creatinine Clearance: 31.3 mL/min (by C-G formula based on SCr of 0.81 mg/dL).   Medical History: Past Medical History:  Diagnosis Date   Bronchitis, not specified as acute or chronic    Essential (primary) hypertension    Other emphysema (HCC)    Tinea unguium    Urge incontinence     Medications:  Eliquis 5 mg BID  Assessment: Pharmacy consulted to dose heparin in this 87 year old female with recurrent DVT of the right lower extremity. Pt was on Eliquis 5 mg PO BID prior to admission. Last dose on 6/21 @ 1900. Heparin Dosing Weight: 52 kg  Hgb: 10.9 Plts: 179  Date Time  aPTT HL Rate/comment 6/22 0831 76 >1.10 600 units/hr  Goal of Therapy:  Heparin level 0.3-0.7 units/ml aPTT 66 - 102 seconds Monitor platelets by anticoagulation protocol: Yes   Plan:  aPTT 76, therapeutic Continue heparin infusion at 600 units/hr.  Will re-check aPTT in 8 hrs Will use aPTT to guide dosing unit HL and aPTT correlate.  Monitor daily CBC and s/s of bleed  7/22, PharmD 04/18/2021,9:39 AM

## 2021-04-18 NOTE — Progress Notes (Signed)
Patient ID: Anne Harrison, female   DOB: 1924-03-25, 87 y.o.   MRN: [id number] [facility] PROGRESS NOTE  Maysel Mccolm EHO:[id number] DOB: [birth year] DOA: 04/17/2021 PCP: [facility]  HPI/Subjective: Patient coming in with worsening swelling of her right leg.  Recently was in the hospital for DVT and had thrombectomy and stent out to facility on Eliquis.  Her leg is becoming more swollen and puffy.  Came back into the hospital and found to have extensive DVT of the right lower extremity.  She was placed on heparin drip.  Objective: Vitals:   04/18/21 0630 04/18/21 0800  BP: (!) 139/51 (!) 144/51  Pulse: (!) 51 (!) 55  Resp: 16 16  Temp:  98.2 F (36.8 C)  SpO2: 95% 94%    Intake/Output Summary (Last 24 hours) at 04/18/2021 1239 Last data filed at 04/18/2021 0851 Gross per 24 hour  Intake 49.88 ml  Output --  Net 49.88 ml   Filed Weights   04/18/21 0004  Weight: 52 kg    ROS: Review of Systems  Respiratory:  Negative for cough and shortness of breath.   Cardiovascular:  Negative for chest pain.  Gastrointestinal:  Negative for abdominal pain, nausea and vomiting.  Musculoskeletal:  Positive for joint pain.  Exam: Physical Exam HENT:     Head: Normocephalic.     Mouth/Throat:     Pharynx: No oropharyngeal exudate.  Eyes:     General: Lids are normal.     Conjunctiva/sclera: Conjunctivae normal.  Cardiovascular:     Rate and Rhythm: Normal rate and regular rhythm.     Heart sounds: Normal heart sounds, S1 normal and S2 normal.  Pulmonary:     Breath sounds: Normal breath sounds. No decreased breath sounds, wheezing, rhonchi or rales.  Abdominal:     Palpations: Abdomen is soft.     Tenderness: There is no abdominal tenderness.  Musculoskeletal:     Right lower leg: Swelling present.     Left lower leg: No swelling.  Skin:    General: Skin is warm.     Comments: Slight pinkish on shin right leg but no warmth.  Neurological:     Mental  Status: She is alert.     Data Reviewed: Basic Metabolic Panel: Recent Labs  Lab 04/17/21 2234 04/18/21 0402  NA 137 141  K 3.9 4.0  CL 98 103  CO2 29 31  GLUCOSE 91 80  BUN 13 11  CREATININE 0.77 0.81  CALCIUM 8.7* 8.8*   Liver Function Tests: Recent Labs  Lab 04/17/21 2234  AST 13*  ALT 7  ALKPHOS 80  BILITOT 0.6  PROT 7.3  ALBUMIN 3.4*   CBC: Recent Labs  Lab 04/17/21 2234 04/18/21 0402  WBC 7.4 7.1  NEUTROABS 4.4  --   HGB 11.7* 10.9*  HCT 35.4* 33.6*  MCV 93.9 93.9  PLT 183 179    Recent Results (from the past 240 hour(s))  Resp Panel by RT-PCR (Flu A&B, Covid) Nasopharyngeal Swab     Status: None   Collection Time: 04/18/21 12:03 AM   Specimen: Nasopharyngeal Swab; Nasopharyngeal(NP) swabs in vial transport medium  Result Value Ref Range Status   SARS Coronavirus 2 by RT PCR NEGATIVE NEGATIVE Final    Comment: (NOTE) SARS-CoV-2 target nucleic acids are NOT DETECTED.  The SARS-CoV-2 RNA is generally detectable in upper respiratory specimens during the acute phase of infection. The lowest concentration of SARS-CoV-2 viral copies this assay can detect is 138 copies/mL. A negative  result does not preclude SARS-Cov-2 infection and should not be used as the sole basis for treatment or other patient management decisions. A negative result may occur with  improper specimen collection/handling, submission of specimen other than nasopharyngeal swab, presence of viral mutation(s) within the areas targeted by this assay, and inadequate number of viral copies(<138 copies/mL). A negative result must be combined with clinical observations, patient history, and epidemiological information. The expected result is Negative.  Fact Sheet for Patients:  BloggerCourse.com  Fact Sheet for Healthcare Providers:  SeriousBroker.it  This test is no t yet approved or cleared by the [place] FDA and  has been  authorized for detection and/or diagnosis of SARS-CoV-2 by FDA under an Emergency Use Authorization (EUA). This EUA will remain  in effect (meaning this test can be used) for the duration of the COVID-19 declaration under Section 564(b)(1) of the Act, 21 U.S.C.section 360bbb-3(b)(1), unless the authorization is terminated  or revoked sooner.       Influenza A by PCR NEGATIVE NEGATIVE Final   Influenza B by PCR NEGATIVE NEGATIVE Final    Comment: (NOTE) The Xpert Xpress SARS-CoV-2/FLU/RSV plus assay is intended as an aid in the diagnosis of influenza from Nasopharyngeal swab specimens and should not be used as a sole basis for treatment. Nasal washings and aspirates are unacceptable for Xpert Xpress SARS-CoV-2/FLU/RSV testing.  Fact Sheet for Patients: BloggerCourse.com  Fact Sheet for Healthcare Providers: SeriousBroker.it  This test is not yet approved or cleared by the [place] FDA and has been authorized for detection and/or diagnosis of SARS-CoV-2 by FDA under an Emergency Use Authorization (EUA). This EUA will remain in effect (meaning this test can be used) for the duration of the COVID-19 declaration under Section 564(b)(1) of the Act, 21 U.S.C. section 360bbb-3(b)(1), unless the authorization is terminated or revoked.  Performed at [facility], [address] [place], [place] [address]      Studies: US Venous Img Lower Unilateral Right  Result Date: 04/18/2021 CLINICAL DATA:  Right lower extremity swelling EXAM: RIGHT LOWER EXTREMITY VENOUS DOPPLER ULTRASOUND TECHNIQUE: Gray-scale sonography with graded compression, as well as color Doppler and duplex ultrasound were performed to evaluate the lower extremity deep venous systems from the level of the common femoral vein and including the common femoral, femoral, profunda femoral, popliteal and calf veins including the posterior tibial, peroneal and  gastrocnemius veins when visible. The superficial great saphenous vein was also interrogated. Spectral Doppler was utilized to evaluate flow at rest and with distal augmentation maneuvers in the common femoral, femoral and popliteal veins. COMPARISON:  04/02/2021 FINDINGS: Contralateral Common Femoral Vein: Respiratory phasicity is normal and symmetric with the symptomatic side. No evidence of thrombus. Normal compressibility. Common Femoral Vein: Thrombus is noted with decreased compressibility. Saphenofemoral Junction: Thrombus is noted with decreased compressibility. Profunda Femoral Vein: Thrombus is noted with decreased compressibility. Femoral Vein: Thrombus is noted with decreased compressibility. Popliteal Vein: Thrombus is noted with decreased compressibility. Calf Veins: Thrombus is noted with decreased compressibility. Superficial Great Saphenous Vein: Thrombus is noted with decreased compressibility from the mid thigh to the saphenofemoral junction. Venous Reflux:  None. Other Findings:  None. IMPRESSION: Diffuse deep venous thrombosis in the right leg similar to that seen on prior exam. Electronically Signed   By: Alcide Clever M.D.   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  Vascular surgery team made her NPO.  Continue heparin drip.  Was on Eliquis upon leaving the hospital last time after vascular procedure.  May have to switch up blood thinner to Lovenox upon disposition but will touch base with the vascular surgery team. Essential  hypertension on amlodipine, Lasix and metoprolol History of COPD on as needed inhaler        Code Status:     Code Status Orders  (From admission, onward)           Start     Ordered   04/18/21 0100  Do not attempt resuscitation (DNR)  Continuous       Question Answer Comment  In the event of cardiac or respiratory ARREST Do not call a "code blue"   In the event of cardiac or respiratory ARREST Do not perform Intubation, CPR, defibrillation or ACLS   In the event of cardiac or respiratory ARREST Use medication by any route, position, wound care, and other measures to relive pain and suffering. May use oxygen, suction and manual treatment of airway obstruction as needed for comfort.      04/18/21 0100           Code Status History     Date Active Date Inactive Code Status Order ID Comments User Context   04/02/2021 1050 04/05/2021 1945 DNR [id number]  Lorretta Harp, MD ED   04/02/2021 0820 04/02/2021 1050 DNR [id number]  Gilles Chiquito, MD ED      Advance Directive Documentation    Flowsheet Row Most Recent Value  Type of Advance Directive Out of facility DNR (pink MOST or yellow form)  Pre-existing out of facility DNR order (yellow form or pink MOST form) --  "MOST" Form in Place? --      Family Communication: Spoke with the patient's daughter at the bedside Disposition Plan: Status is: Inpatient  Dispo: The patient is from: Rehab              Anticipated d/c is to: Rehab versus assisted living.              Patient currently n.p.o. for vascular procedure   Difficult to place patient.  Hopefully not  Consultants: Vascular surgery  Time spent: 32 minutes  Nannie Starzyk [employer]

## 2021-04-18 NOTE — Progress Notes (Signed)
Chaplain Maggie made initial visit with patient and her daughter, Anne Harrison. Patient was awake and in good spirits while she waits for an upcoming procedure. Chaplain made room for storytelling and prayer of traditional prayers with patient who is Catholic. Continued spiritual and social support available as needed per on call Chaplain.

## 2021-04-18 NOTE — Progress Notes (Signed)
ANTICOAGULATION CONSULT NOTE - Initial Consult  Pharmacy Consult for Heparin Indication: VTE treatment  Allergies  Allergen Reactions   Ciprofloxacin Hives    Patient Measurements: Height: 4\' 11"  (149.9 cm) Weight: 52 kg (114 lb 10.2 oz) IBW/kg (Calculated) : 43.2 Heparin Dosing Weight: 52 kg   Vital Signs: Temp: 98.1 F (36.7 C) (06/22 1526) Temp Source: Oral (06/22 1526) BP: 120/65 (06/22 1715) Pulse Rate: 62 (06/22 1715)  Labs: Recent Labs    04/17/21 2234 04/18/21 0402 04/18/21 0831  HGB 11.7* 10.9*  --   HCT 35.4* 33.6*  --   PLT 183 179  --   APTT 66*  --  76*  LABPROT 18.6*  --   --   INR 1.6*  --   --   HEPARINUNFRC >1.10*  --  >1.10*  CREATININE 0.77 0.81  --      Estimated Creatinine Clearance: 31.3 mL/min (by C-G formula based on SCr of 0.81 mg/dL).   Medical History: Past Medical History:  Diagnosis Date   Bronchitis, not specified as acute or chronic    Essential (primary) hypertension    Other emphysema (HCC)    Tinea unguium    Urge incontinence     Medications:  Eliquis 5 mg BID  Assessment: Pharmacy consulted to dose heparin in this 87 year old female with recurrent DVT of the right lower extremity. Pt was on Eliquis 5 mg PO BID prior to admission. Last dose on 6/21 @ 1900. Heparin Dosing Weight: 52 kg  Hgb: 10.9 Plts: 179  Date Time  aPTT HL Rate/comment 6/22 0831 76 >1.10 600 units/hr  Goal of Therapy:  Heparin level 0.3-0.7 units/ml aPTT 66 - 102 seconds Monitor platelets by anticoagulation protocol: Yes   Plan:  Will resume heparin infusion post-procedure at 600 units/hr this evening@1800 . Will re-check aPTT in 8 hrs Will use aPTT to guide dosing until HL and aPTT correlate.  Monitor daily CBC and s/s of bleed  7/22, PharmD 04/18/2021,5:34 PM

## 2021-04-18 NOTE — Progress Notes (Signed)
ANTICOAGULATION CONSULT NOTE - Initial Consult  Pharmacy Consult for Heparin Indication: VTE treatment  Allergies  Allergen Reactions   Ciprofloxacin Hives    Patient Measurements: Height: 4\' 11"  (149.9 cm) Weight: 52 kg (114 lb 10.2 oz) IBW/kg (Calculated) : 43.2 Heparin Dosing Weight: 52 kg   Vital Signs: Temp: 97.8 F (36.6 C) (06/21 2125) Temp Source: Axillary (06/21 2125) BP: 131/64 (06/22 0030) Pulse Rate: 51 (06/22 0030)  Labs: Recent Labs    04/17/21 2234  HGB 11.7*  HCT 35.4*  PLT 183  APTT 66*  LABPROT 18.6*  INR 1.6*  HEPARINUNFRC >1.10*  CREATININE 0.77    Estimated Creatinine Clearance: 31.7 mL/min (by C-G formula based on SCr of 0.77 mg/dL).   Medical History: Past Medical History:  Diagnosis Date   Bronchitis, not specified as acute or chronic    Essential (primary) hypertension    Other emphysema (HCC)    Tinea unguium    Urge incontinence     Medications:  (Not in a hospital admission)   Assessment: Pharmacy consulted to dose heparin in this 87 year old female for VTE treatment.   Pt was on Eliquis 5 mg PO BID.  Last dose on 6/21 @ 1900. CrCl = 31.7 ml/min  Goal of Therapy:  Heparin level 0.3-0.7 units/ml aPTT 66 - 102 seconds Monitor platelets by anticoagulation protocol: Yes   Plan:  Will not bolus this pt due to recent use of Eliquis. Will order heparin 600 units/hr.  Will check aPTT and HL 8 hrs after start of drip. Will use aPTT to guide dosing unit HL and aPTT are therapeutic.   Frederika Hukill D 04/18/2021,12:59 AM

## 2021-04-18 NOTE — Op Note (Signed)
[facility]   OPERATIVE NOTE    PRE-OPERATIVE DIAGNOSIS: DVT with failure of anticoagulation  POST-OPERATIVE DIAGNOSIS: same as above  PROCEDURE: 1.   Ultrasound guidance for vascular access to the right vein 2.   Catheter placement into the inferior vena cava 3.   Inferior venacavogram 4.   Placement of a Bard Denali IVC filter  SURGEON: Festus [facility], MD  ASSISTANT(S): None  ANESTHESIA: local with Moderate Conscious Sedation for approximately 6 minutes using 0.5 mg of Versed and 25 mcg of Fentanyl  ESTIMATED BLOOD LOSS: minimal  CONTRAST: 15 cc  FLUORO TIME: less than one minute  FINDING(S): 1.  Patent IVC  SPECIMEN(S):  none  INDICATIONS:   Anne Harrison is a 87 y.o. female who presents with failure of anticoagulation and worsening DVT after previous intervention.  Inferior vena cava filter is indicated for this reason.  Risks and benefits including filter thrombosis, migration, fracture, bleeding, and infection were all discussed.  We discussed that all IVC filters that we place can be removed if desired from the patient once the need for the filter has passed.    DESCRIPTION: After obtaining full informed written consent, the patient was brought back to the vascular suite. The skin was sterilely prepped and draped in a sterile surgical field was created. Moderate conscious sedation was administered during a face to face encounter with the patient throughout the procedure with my supervision of the RN administering medicines and monitoring the patient's vital signs, pulse oximetry, telemetry and mental status throughout from the start of the procedure until the patient was taken to the recovery room. The right femoral vein was accessed under direct ultrasound guidance without difficulty with a Seldinger needle and a J-wire was then placed. After skin nick and dilatation, the delivery sheath was placed into the inferior vena cava and an inferior  venacavogram was performed. This demonstrated a patent IVC with the level of the renal veins at L1.  The filter was then deployed into the inferior vena cava at the level of L2 just below the renal veins. The delivery sheath was then removed. Pressure was held. Sterile dressings were placed. The patient tolerated the procedure well and was taken to the recovery room in stable condition.  COMPLICATIONS: None  CONDITION: Stable  Festus [facility]  04/18/2021, 4:37 PM   This note was created with Dragon Medical transcription system. Any errors in dictation are purely unintentional.

## 2021-04-18 NOTE — ED Notes (Signed)
Patient transported to MRI 

## 2021-04-19 ENCOUNTER — Encounter: Payer: Self-pay | Admitting: Vascular Surgery

## 2021-04-19 ENCOUNTER — Inpatient Hospital Stay: Payer: Medicare Other

## 2021-04-19 LAB — BASIC METABOLIC PANEL
Anion gap: 5 (ref 5–15)
BUN: 9 mg/dL (ref 8–23)
CO2: 28 mmol/L (ref 22–32)
Calcium: 8.6 mg/dL — ABNORMAL LOW (ref 8.9–10.3)
Chloride: 104 mmol/L (ref 98–111)
Creatinine, Ser: 0.66 mg/dL (ref 0.44–1.00)
GFR, Estimated: >60 mL/min (ref >60)
Glucose, Bld: 88 mg/dL (ref 70–99)
Potassium: 3.8 mmol/L (ref 3.5–5.1)
Sodium: 137 mmol/L (ref 135–145)

## 2021-04-19 LAB — CBC
HCT: 33.6 % — ABNORMAL LOW (ref 36.0–46.0)
Hemoglobin: 11 g/dL — ABNORMAL LOW (ref 12.0–15.0)
MCH: 30.5 pg (ref 26.0–34.0)
MCHC: 32.7 g/dL (ref 30.0–36.0)
MCV: 93.1 fL (ref 80.0–100.0)
Platelets: 169 10*3/uL (ref 150–400)
RBC: 3.61 MIL/uL — ABNORMAL LOW (ref 3.87–5.11)
RDW: 14.8 % (ref 11.5–15.5)
WBC: 6.4 10*3/uL (ref 4.0–10.5)
nRBC: 0 % (ref 0.0–0.2)

## 2021-04-19 LAB — HEPARIN LEVEL (UNFRACTIONATED): Heparin Unfractionated: >1.1 IU/mL — ABNORMAL HIGH (ref 0.30–0.70)

## 2021-04-19 LAB — APTT: aPTT: 89 seconds — ABNORMAL HIGH (ref 24–36)

## 2021-04-19 MED ORDER — DABIGATRAN ETEXILATE MESYLATE 150 MG PO CAPS: 150.0000 mg | ORAL_CAPSULE | Freq: Two times a day (BID) | ORAL | Status: DC

## 2021-04-19 MED ORDER — ENOXAPARIN SODIUM 60 MG/0.6ML IJ SOSY
1.0000 mg/kg | PREFILLED_SYRINGE | Freq: Two times a day (BID) | INTRAMUSCULAR | Status: DC
  Administered 2021-04-19 – 2021-04-20 (×3): 52.5 mg via SUBCUTANEOUS
  Filled 2021-04-19 (×3): qty 0.6

## 2021-04-19 MED ORDER — ALBUTEROL SULFATE (2.5 MG/3ML) 0.083% IN NEBU: 2.5000 mg | INHALATION_SOLUTION | Freq: Four times a day (QID) | RESPIRATORY_TRACT | Status: DC | PRN

## 2021-04-19 MED ORDER — IOHEXOL 9 MG/ML PO SOLN
500.0000 mL | Freq: Once | ORAL | Status: DC | PRN
  Administered 2021-04-19: 500 mL via ORAL

## 2021-04-19 NOTE — Progress Notes (Signed)
Patient ID: Anne Harrison, female   DOB: 1924-08-24, 87 y.o.   MRN: [id number] [facility] PROGRESS NOTE  Anne Harrison QVZ:[id number] DOB: 11-04-1923 DOA: 04/17/2021 PCP: [facility]  HPI/Subjective: Patient feeling okay.  Right lower extremity swelling is better today.  An IVC filter placed yesterday.  Otherwise feels okay.  Admitted with recurrent right lower extremity DVT.  Objective: Vitals:   04/19/21 0922 04/19/21 1119  BP: 132/86 121/75  Pulse: (!) 57 (!) 57  Resp: 18 16  Temp: 97.7 F (36.5 C) 98.1 F (36.7 C)  SpO2: 94% 93%    Intake/Output Summary (Last 24 hours) at 04/19/2021 1338 Last data filed at 04/19/2021 1100 Gross per 24 hour  Intake 578.89 ml  Output 850 ml  Net -271.11 ml   Filed Weights   04/18/21 0004  Weight: 52 kg    ROS: Review of Systems  Respiratory:  Negative for shortness of breath.   Cardiovascular:  Negative for chest pain.  Gastrointestinal:  Negative for abdominal pain, nausea and vomiting.  Exam: Physical Exam HENT:     Head: Normocephalic.     Mouth/Throat:     Pharynx: No oropharyngeal exudate.  Eyes:     General: Lids are normal.     Conjunctiva/sclera: Conjunctivae normal.  Cardiovascular:     Rate and Rhythm: Normal rate and regular rhythm.     Heart sounds: Normal heart sounds, S1 normal and S2 normal.  Pulmonary:     Breath sounds: Normal breath sounds. No decreased breath sounds, wheezing, rhonchi or rales.  Abdominal:     Palpations: Abdomen is soft.     Tenderness: There is no abdominal tenderness.  Musculoskeletal:     Right lower leg: Swelling present.     Left lower leg: No swelling.  Skin:    General: Skin is warm.     Findings: No rash.  Neurological:     Mental Status: She is alert.     Comments: Answers all questions appropriately.     Data Reviewed: Basic Metabolic Panel: Recent Labs  Lab 04/17/21 2234 04/18/21 0402 04/19/21 0206  NA 137 141 137  K 3.9 4.0 3.8  CL 98 103  104  CO2 29 31 28   GLUCOSE 91 80 88  BUN 13 11 9   CREATININE 0.77 0.81 0.66  CALCIUM 8.7* 8.8* 8.6*   Liver Function Tests: Recent Labs  Lab 04/17/21 2234  AST 13*  ALT 7  ALKPHOS 80  BILITOT 0.6  PROT 7.3  ALBUMIN 3.4*    CBC: Recent Labs  Lab 04/17/21 2234 04/18/21 0402 04/19/21 0206  WBC 7.4 7.1 6.4  NEUTROABS 4.4  --   --   HGB 11.7* 10.9* 11.0*  HCT 35.4* 33.6* 33.6*  MCV 93.9 93.9 93.1  PLT 183 179 169     Recent Results (from the past 240 hour(s))  Resp Panel by RT-PCR (Flu A&B, Covid) Nasopharyngeal Swab     Status: None   Collection Time: 04/18/21 12:03 AM   Specimen: Nasopharyngeal Swab; Nasopharyngeal(NP) swabs in vial transport medium  Result Value Ref Range Status   SARS Coronavirus 2 by RT PCR NEGATIVE NEGATIVE Final    Comment: (NOTE) SARS-CoV-2 target nucleic acids are NOT DETECTED.  The SARS-CoV-2 RNA is generally detectable in upper respiratory specimens during the acute phase of infection. The lowest concentration of SARS-CoV-2 viral copies this assay can detect is 138 copies/mL. A negative result does not preclude SARS-Cov-2 infection and should not be used as the sole basis  for treatment or other patient management decisions. A negative result may occur with  improper specimen collection/handling, submission of specimen other than nasopharyngeal swab, presence of viral mutation(s) within the areas targeted by this assay, and inadequate number of viral copies(<138 copies/mL). A negative result must be combined with clinical observations, patient history, and epidemiological information. The expected result is Negative.  Fact Sheet for Patients:  BloggerCourse.com  Fact Sheet for Healthcare Providers:  SeriousBroker.it  This test is no t yet approved or cleared by the [place] FDA and  has been authorized for detection and/or diagnosis of SARS-CoV-2 by FDA under an Emergency Use  Authorization (EUA). This EUA will remain  in effect (meaning this test can be used) for the duration of the COVID-19 declaration under Section 564(b)(1) of the Act, 21 U.S.C.section 360bbb-3(b)(1), unless the authorization is terminated  or revoked sooner.       Influenza A by PCR NEGATIVE NEGATIVE Final   Influenza B by PCR NEGATIVE NEGATIVE Final    Comment: (NOTE) The Xpert Xpress SARS-CoV-2/FLU/RSV plus assay is intended as an aid in the diagnosis of influenza from Nasopharyngeal swab specimens and should not be used as a sole basis for treatment. Nasal washings and aspirates are unacceptable for Xpert Xpress SARS-CoV-2/FLU/RSV testing.  Fact Sheet for Patients: BloggerCourse.com  Fact Sheet for Healthcare Providers: SeriousBroker.it  This test is not yet approved or cleared by the [place] FDA and has been authorized for detection and/or diagnosis of SARS-CoV-2 by FDA under an Emergency Use Authorization (EUA). This EUA will remain in effect (meaning this test can be used) for the duration of the COVID-19 declaration under Section 564(b)(1) of the Act, 21 U.S.C. section 360bbb-3(b)(1), unless the authorization is terminated or revoked.  Performed at [facility], [address], [place], [place] [address]      Studies: PERIPHERAL VASCULAR CATHETERIZATION  Result Date: 04/18/2021 See surgical note for result.  US Venous Img Lower Unilateral Right  Result Date: 04/18/2021 CLINICAL DATA:  Right lower extremity swelling EXAM: RIGHT LOWER EXTREMITY VENOUS DOPPLER ULTRASOUND TECHNIQUE: Gray-scale sonography with graded compression, as well as color Doppler and duplex ultrasound were performed to evaluate the lower extremity deep venous systems from the level of the common femoral vein and including the common femoral, femoral, profunda femoral, popliteal and calf veins including the posterior tibial, peroneal  and gastrocnemius veins when visible. The superficial great saphenous vein was also interrogated. Spectral Doppler was utilized to evaluate flow at rest and with distal augmentation maneuvers in the common femoral, femoral and popliteal veins. COMPARISON:  04/02/2021 FINDINGS: Contralateral Common Femoral Vein: Respiratory phasicity is normal and symmetric with the symptomatic side. No evidence of thrombus. Normal compressibility. Common Femoral Vein: Thrombus is noted with decreased compressibility. Saphenofemoral Junction: Thrombus is noted with decreased compressibility. Profunda Femoral Vein: Thrombus is noted with decreased compressibility. Femoral Vein: Thrombus is noted with decreased compressibility. Popliteal Vein: Thrombus is noted with decreased compressibility. Calf Veins: Thrombus is noted with decreased compressibility. Superficial Great Saphenous Vein: Thrombus is noted with decreased compressibility from the mid thigh to the saphenofemoral junction. Venous Reflux:  None. Other Findings:  None. IMPRESSION: Diffuse deep venous thrombosis in the right leg similar to that seen on prior exam. Electronically Signed   By: Alcide Clever M.D.   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  Patient had a recent vascular procedure and was on Eliquis as outpatient.  Patient had recurrent swelling and pain and was started on heparin drip.  Vascular team did an IVC filter yesterday.  Will treat this as a treatment  failure.  Lovenox injections started today.  Her facility will not do Lovenox injections but her daughter is willing to do Lovenox injections for a few days if need be.  We will start Pradaxa on Sunday.  CT scan abdomen pelvis. Essential hypertension on amlodipine Lasix and metoprolol History of COPD on as needed inhaler Weakness.  Physical therapy evaluation.  The patient did come from rehab so would like to figure out whether she needs to go back to the rehab or can go back to her assisted living.        Code Status:     Code Status Orders  (From admission, onward)           Start     Ordered   04/18/21 0100  Do not attempt resuscitation (DNR)  Continuous       Question Answer Comment  In the event of cardiac or respiratory ARREST Do not call a "code blue"   In the event of cardiac or respiratory ARREST Do not perform Intubation, CPR, defibrillation or ACLS   In the event of cardiac or respiratory ARREST Use medication by any route, position, wound care, and other measures to relive pain and suffering. May use oxygen, suction and manual treatment of airway obstruction as needed for comfort.      06 /22/22 0100           Code Status History     Date Active Date Inactive Code Status Order ID Comments User Context   04/02/2021 1050 04/05/2021 1945 DNR 06/05/2021  [id number], MD ED   04/02/2021 0820 04/02/2021 1050 DNR 06/02/2021  [id number], MD ED      Advance Directive Documentation    Flowsheet Row Most Recent Value  Type of Advance Directive Out of facility DNR (pink MOST or yellow form)  Pre-existing out of facility DNR order (yellow form or pink MOST form) Pink Most/Yellow Form available - Physician notified to receive inpatient order  "MOST" Form in Place? --      Family Communication: Spoke with patient's daughter on the phone Disposition Plan: Status is: Inpatient  Dispo: The patient is from: Rehab              Anticipated d/c is to: Rehab versus assisted  living depending on how she does with physical therapy              Patient currently switching blood thinners for treatment failure and recurrent DVT.   Difficult to place patient.  No.  Time spent: 28 minutes, case discussed with transitional care team and patient's daughter.  Also spoke with Dr. Gilles Chiquito vascular surgery  Wyn Quaker  [facility]

## 2021-04-19 NOTE — TOC Initial Note (Addendum)
Transition of Care [facility]) - Initial/Assessment Note    Patient Details  Name: Anne Harrison MRN: [id number] Date of Birth: Jan 03, 1924  [facility]) CM/SW Contact:    Chapman Fitch, RN Phone Number: 04/19/2021, 10:38 AM  Clinical Narrative:                  Patient admitted from [facility] and rehab At baseline patient lives at [facility] and is ambulatory with a rollator  Spoke with daughter.  Her preference is for patient to return to [facility] and continue therapy.   PT eval pending  I spoke with Harriett Sine at [facility].  If patient needs Lovenox shots [facility] will not be able to administer   Update:  Per daughter if patient returns to [facility] daughter will come to ALF 2 times a day to administer Lovenox  Expected Discharge Plan: Skilled Nursing Facility Barriers to Discharge: Continued Medical Work up   Patient Goals and CMS Choice        Expected Discharge Plan and Services Expected Discharge Plan: Skilled Nursing Facility   Discharge Planning Services: CM Consult   Living arrangements for the past 2 months: Skilled Nursing Facility                                      Prior Living Arrangements/Services Living arrangements for the past 2 months: Skilled Nursing Facility Lives with:: Facility Resident Patient language and need for interpreter reviewed:: Yes Do you feel safe going back to the place where you live?: Yes      Need for Family Participation in Patient Care: Yes (Comment) Care giver support system in place?: Yes (comment) Current home services: DME Criminal Activity/Legal Involvement Pertinent to Current Situation/Hospitalization: No - Comment as needed  Activities of Daily Living Home Assistive Devices/Equipment: Built-in shower seat, Dentures (specify type), Grab bars around toilet, Grab bars in shower, Walker (specify type) ADL Screening (condition at time of admission) Patient's cognitive ability adequate to  safely complete daily activities?: Yes Is the patient deaf or have difficulty hearing?: Yes Does the patient have difficulty seeing, even when wearing glasses/contacts?: No Does the patient have difficulty concentrating, remembering, or making decisions?: No Patient able to express need for assistance with ADLs?: Yes Does the patient have difficulty dressing or bathing?: Yes Independently performs ADLs?: No Communication: Independent Dressing (OT): Needs assistance Is this a change from baseline?: Pre-admission baseline Grooming: Needs assistance Is this a change from baseline?: Pre-admission baseline Feeding: Independent Bathing: Needs assistance Is this a change from baseline?: Pre-admission baseline Toileting: Needs assistance Is this a change from baseline?: Pre-admission baseline In/Out Bed: Needs assistance Is this a change from baseline?: Pre-admission baseline Walks in Home: Independent with device (comment) Is this a change from baseline?: Pre-admission baseline Does the patient have difficulty walking or climbing stairs?: Yes Weakness of Legs: Right Weakness of Arms/Hands: None  Permission Sought/Granted                  Emotional Assessment       Orientation: : Oriented to Self Alcohol / Substance Use: Not Applicable Psych Involvement: No (comment)  Admission diagnosis:  Recurrent acute deep vein thrombosis (DVT) of right lower extremity (HCC) [I82.401] Acute deep vein thrombosis (DVT) of right lower extremity, unspecified vein (HCC) [I82.401] Patient Active Problem List   Diagnosis Date Noted   Recurrent acute deep vein thrombosis (DVT)  of right lower extremity (HCC) 04/18/2021   Pulmonary embolism (HCC) 04/02/2021   DVT (deep venous thrombosis) (HCC)_right leg 04/02/2021   COVID-19 virus infection 04/02/2021   Chronic obstructive pulmonary disease (HCC)    Onychomycosis 10/22/2017   Essential hypertension 10/07/2017   PCP:  Almetta Lovely, [facility]:   [facility] - [employer], [place] - [address] [place] [address] Phone: [phone] Fax: [phone]  [facility] - [place], [place] - [address] [place] [address] Phone: [phone] Fax: [phone]     Social Determinants of Health (SDOH) Interventions    Readmission Risk Interventions No flowsheet data found.

## 2021-04-19 NOTE — Progress Notes (Signed)
ANTICOAGULATION CONSULT NOTE - Initial Consult  Pharmacy Consult for Heparin Indication: VTE treatment  Allergies  Allergen Reactions   Ciprofloxacin Hives    Patient Measurements: Height: 4\' 11"  (149.9 cm) Weight: 52 kg (114 lb 10.2 oz) IBW/kg (Calculated) : 43.2 Heparin Dosing Weight: 52 kg   Vital Signs: Temp: 97.6 F (36.4 C) (06/23 0009) Temp Source: Oral (06/23 0009) BP: 127/93 (06/23 0009) Pulse Rate: 66 (06/23 0009)  Labs: Recent Labs    04/17/21 2234 04/18/21 0402 04/18/21 0831 04/19/21 0206  HGB 11.7* 10.9*  --  11.0*  HCT 35.4* 33.6*  --  33.6*  PLT 183 179  --  169  APTT 66*  --  76* 89*  LABPROT 18.6*  --   --   --   INR 1.6*  --   --   --   HEPARINUNFRC >1.10*  --  >1.10* >1.10*  CREATININE 0.77 0.81  --  0.66     Estimated Creatinine Clearance: 31.7 mL/min (by C-G formula based on SCr of 0.66 mg/dL).   Medical History: Past Medical History:  Diagnosis Date   Bronchitis, not specified as acute or chronic    Essential (primary) hypertension    Other emphysema (HCC)    Tinea unguium    Urge incontinence     Medications:  Eliquis 5 mg BID  Assessment: Pharmacy consulted to dose heparin in this 87 year old female with recurrent DVT of the right lower extremity. Pt was on Eliquis 5 mg PO BID prior to admission. Last dose on 6/21 @ 1900. Heparin Dosing Weight: 52 kg  Hgb: 10.9 Plts: 179  Date Time  aPTT HL Rate/comment 6/22 0831 76 >1.10 600 units/hr 6/23     0206    89       >1.10   600 units/hr  Goal of Therapy:  Heparin level 0.3-0.7 units/ml aPTT 66 - 102 seconds Monitor platelets by anticoagulation protocol: Yes   Plan:  6/23 @ 0206:   aPTT = 89,  HL = > 1.10 APTT is therapeutic X 1 but HL still elevated.  Will continue to use aPTT to guide dosing.  Will continue pt on current rate and draw confirmation level in 8 hrs on 6/23 @ 1000. Will recheck HL on 6/23 with AM labs.   Graysin Luczynski D, PharmD 04/19/2021,3:10 AM

## 2021-04-19 NOTE — Progress Notes (Signed)
ANTICOAGULATION CONSULT NOTE  Pharmacy Consult for Heparin Indication: VTE treatment  Allergies  Allergen Reactions   Ciprofloxacin Hives    Patient Measurements: Height: 4\' 11"  (149.9 cm) Weight: 52 kg (114 lb 10.2 oz) IBW/kg (Calculated) : 43.2 Heparin Dosing Weight: 52 kg   Vital Signs: Temp: 97.5 F (36.4 C) (06/23 0417) Temp Source: Oral (06/23 0417) BP: 132/67 (06/23 0417) Pulse Rate: 62 (06/23 0417)  Labs: Recent Labs    04/17/21 2234 04/18/21 0402 04/18/21 0831 04/19/21 0206  HGB 11.7* 10.9*  --  11.0*  HCT 35.4* 33.6*  --  33.6*  PLT 183 179  --  169  APTT 66*  --  76* 89*  LABPROT 18.6*  --   --   --   INR 1.6*  --   --   --   HEPARINUNFRC >1.10*  --  >1.10* >1.10*  CREATININE 0.77 0.81  --  0.66     Estimated Creatinine Clearance: 31.7 mL/min (by C-G formula based on SCr of 0.66 mg/dL).   Medical History: Past Medical History:  Diagnosis Date   Bronchitis, not specified as acute or chronic    Essential (primary) hypertension    Other emphysema (HCC)    Tinea unguium    Urge incontinence     Assessment: Pharmacy consulted to dose LMWH and dabigatran in this 87 year old female with recurrent DVT of the right lower extremity. She was on Eliquis 5 mg PO BID prior to admission and claims to be compliant with dispense records corroborating this claim. She was started on IV heparin but now being changed over to enoxaparin until dabigatran is appropriate to start. She is now s/p IVC filter placement by vascular surgery  Goal of Therapy:  Monitor platelets by anticoagulation protocol: Yes   Plan:  Stop IV heparin Start enoxaparin 1 mg/kg subcutaneously every 12 hours for a total of 5 days combined IV heparin and enoxaparin (heparin was started 06/22 at 0028) 2 hours after last dose of enoxaparin is administered start dabigatran 150 mg po BID  7/22, PharmD 04/19/2021,7:14 AM

## 2021-04-19 NOTE — Progress Notes (Signed)
[facility] Daily Progress Note   04/18/21: 1.   Ultrasound guidance for vascular access to the right vein 2.   Catheter placement into the inferior vena cava 3.   Inferior venacavogram 4.   Placement of a Bard Denali IVC filter  Subjective: Patient is resting comfortably in bed.  No family at bedside this AM.  No acute issues overnight.  Patient remains without complaint.  Objective: Vitals:   04/18/21 2109 04/19/21 0009 04/19/21 0417 04/19/21 0922  BP:  (!) 127/93 132/67 132/86  Pulse:  66 62 (!) 57  Resp:  18 20 18   Temp:  97.6 F (36.4 C) (!) 97.5 F (36.4 C) 97.7 F (36.5 C)  TempSrc:  Oral Oral Oral  SpO2: 92% 93% 91% 94%  Weight:      Height:        Intake/Output Summary (Last 24 hours) at 04/19/2021 1041 Last data filed at 04/19/2021 0428 Gross per 24 hour  Intake 482.89 ml  Output 350 ml  Net 132.89 ml   Physical Exam: A&Ox3, NAD CV: RRR Pulmonary: CTA Bilaterally Abdomen: Soft, Nontender, Nondistended Right groin:  No PAD on exam.  Dressing is clean dry and intact.  No swelling or drainage. Vascular:  Right lower extremity: Thigh soft.  Calf is soft but edematous.  There is no acute vascular compromise noted to the extremity at this time.  Hard to palpate pedal pulses due to edema however the foot is warm.  Good capillary refill.  Motor/sensory is intact.  Nontender to palpation.   Laboratory: CBC    Component Value Date/Time   WBC 6.4 04/19/2021 0206   HGB 11.0 (L) 04/19/2021 0206   HCT 33.6 (L) 04/19/2021 0206   PLT 169 04/19/2021 0206   BMET    Component Value Date/Time   NA 137 04/19/2021 0206   K 3.8 04/19/2021 0206   CL 104 04/19/2021 0206   CO2 28 04/19/2021 0206   GLUCOSE 88 04/19/2021 0206   BUN 9 04/19/2021 0206   CREATININE 0.66 04/19/2021 0206   CALCIUM 8.6 (L) 04/19/2021 0206   GFRNONAA >60 04/19/2021 0206   GFRAA >60 07/11/2020 1245   Assessment/Planning: The patient is a 87 year old female with a known  history of right lower extremity DVT status post thrombolysis thrombectomy on April 03, 2021.  Patient presented with recurrent DVT to the same leg now status post IVC filter placement - POD#1  1) patient is doing well s/p IVC filter placement. She continues to remain asymptomatic to the right lower extremity. On exam, the extremity is without any acute issue. Edematous but soft.  Motor/sensory is intact. 2) would assume with a recurrent DVT and unknown compliancy with Eliquis (patient's nursing facility states that she has been compliant) the patient's IVC filter would remain in place indefinitely however we will see her in about 6 to 8 weeks in our outpatient clinic. 3) patient is currently on Lovenox with a bridge to Pradaxa. 4) vascular surgery will sign off at this time.  Please do not agitate if there are any issues.  Discussed with Dr. April 05, 2021 Aymee Fomby PA-C 04/19/2021 10:41 AM

## 2021-04-19 NOTE — TOC Progression Note (Signed)
Transition of Care [facility]) - Progression Note    Patient Details  Name: Anne Harrison MRN: [id number] Date of Birth: 03-16-1924  Transition of Care [facility]) CM/SW Contact  Chapman Fitch, RN Phone Number: 04/19/2021, 2:24 PM  Clinical Narrative:     PT still pending.  Per Harriett Sine at [facility] if PT recommends ALF,  Harriett Sine will have to come to evaluable patient before she returns.  If it is deemed that patient can return then daughter can administer Lovenox at the facility    Expected Discharge Plan: Skilled Nursing Facility Barriers to Discharge: Continued Medical Work up  Expected Discharge Plan and Services Expected Discharge Plan: Skilled Nursing Facility   Discharge Planning Services: CM Consult   Living arrangements for the past 2 months: Skilled Nursing Facility                                       Social Determinants of Health (SDOH) Interventions    Readmission Risk Interventions No flowsheet data found.

## 2021-04-19 NOTE — Evaluation (Signed)
Physical Therapy Evaluation Patient Details Name: Anne Harrison MRN: [id number] DOB: Mar 22, 1924 Today's Date: 04/19/2021   History of Present Illness  Per MD notes, pt is a 87 y.o. female who presents with failure of anticoagulation and worsening RLE DVT after previous intervention. Pt is s/p placement of Bard Denali IVC filter.  PMH includes chronic obstructive pulmonary disease, COPD, emphysema, and recurrent deep vein thrombosis RLE. MD assessment includes recurrent deep vein thrombosis right lower extremity and weakness.  Clinical Impression  Pt was pleasant and motivated to participate during the session. Pt's SpO2 and HR WNL throughout session. Pt required min-mod physical assistance for functional mobility as well as frequent cueing for sequencing and extremity placement. Pt reports a fear of falling but remained steady while ambulating with no LOB but required min assist to avoid obstacles and verbal cues for turning with walker.  Pt will benefit from HHPT upon discharge to safely address deficits listed in patient problem list for decreased caregiver assistance and eventual return to PLOF.     Follow Up Recommendations Home health PT;Supervision for mobility/OOB    Equipment Recommendations  None recommended by PT    Recommendations for Other Services       Precautions / Restrictions Precautions Precautions: Fall Restrictions Weight Bearing Restrictions: No      Mobility  Bed Mobility Overal bed mobility: Needs Assistance Bed Mobility: Rolling;Sidelying to Sit Rolling: Min guard;Min assist Sidelying to sit: Min assist;Mod assist       General bed mobility comments: Pt require verbal cueing for sequencing and min-mod assit for trunk control and LEs.    Transfers Overall transfer level: Needs assistance Equipment used: Rolling walker (2 wheeled) Transfers: Sit to/from Stand Sit to Stand: Min assist         General transfer comment: Verbal and tactile cues for  feet placement, hand placement, and sequencing.  Ambulation/Gait Ambulation/Gait assistance: Min guard;Min assist Gait Distance (Feet): 100 Feet Assistive device: Rolling walker (2 wheeled) Gait Pattern/deviations: Step-to pattern;Step-through pattern;Decreased stride length;Drifts right/left Gait velocity: decreased   General Gait Details: Min physical assist guiding walker to avoid obstacles. Verbal cueing while turning with walker for sequencing. Pt able to increase cadence with verbal cueing. Pt reported a little pain to R hip after walking halfway.  Stairs            Wheelchair Mobility    Modified Rankin (Stroke Patients Only)       Balance Overall balance assessment: Needs assistance Sitting-balance support: Bilateral upper extremity supported;Feet supported Sitting balance-Leahy Scale: Fair   Postural control: Posterior lean Standing balance support: Bilateral upper extremity supported Standing balance-Leahy Scale: Fair Standing balance comment: requires UE Support                             Pertinent Vitals/Pain Pain Assessment: 0-10 (Pt reported a little pain while ambulating at right hip.) Pain Score:  (unable to provide number) Pain Descriptors / Indicators: Aching;Sore Pain Intervention(s): Monitored during session;Repositioned    Home Living Family/patient expects to be discharged to:: Assisted living               Home Equipment: Walker - 4 wheels Additional Comments: Patient poor historian, unable to provide details.  Per daughter at bed side, resident of [facility]. All pt history provided by daughter.   Prior Function Level of Independence: Needs assistance   Gait / Transfers Assistance Needed: Per daughter, pt ambulates with 4WRW at baseline  but needed transfered via wheelchair at ALF for long distances such as to cafeteria for meals secondary to DVT.     Comments: Patient poor historian, unable to provide details.  Per  daughter, ambulatory with [phone], goes shopping with daughter every Wednesday and Saturday. No home O2.     Hand Dominance        Extremity/Trunk Assessment   Upper Extremity Assessment Upper Extremity Assessment: Overall WFL for tasks assessed    Lower Extremity Assessment Lower Extremity Assessment: Overall WFL for tasks assessed    Cervical / Trunk Assessment Cervical / Trunk Assessment: Kyphotic  Communication   Communication: No difficulties  Cognition Arousal/Alertness: Awake/alert Behavior During Therapy: WFL for tasks assessed/performed Overall Cognitive Status: History of cognitive impairments - at baseline                                 General Comments: AOx1 to self; follows simples commands sometimes requiring demonstration or hand-over-hand assist from therapist.      General Comments      Exercises Total Joint Exercises Marching in Standing: AROM;Strengthening;Both;10 reps;Standing Other Exercises Other Exercises: Anterior weight shift seated to improved posture and transfers. Other Exercises: Anterior weight shift standing. Other Exercises: Sit<>stand transfer training. Other Exercises: Log roll training for improved bed mobility.   Assessment/Plan    PT Assessment Patient needs continued PT services  PT Problem List Decreased strength;Decreased activity tolerance;Decreased balance;Decreased mobility;Decreased cognition;Decreased knowledge of use of DME;Decreased safety awareness;Pain       PT Treatment Interventions DME instruction;Gait training;Stair training;Functional mobility training;Therapeutic activities;Therapeutic exercise;Balance training;Patient/family education   PT Goals (Current goals can be found in the Care Plan section)  Acute Rehab PT Goals Patient Stated Goal: Better balance PT Goal Formulation: With patient Time For Goal Achievement: 05/02/21 Potential to Achieve Goals: Good    Frequency Min 2X/week    Barriers to discharge        Co-evaluation               AM-PAC PT "6 Clicks" Mobility  Outcome Measure Help needed turning from your back to your side while in a flat bed without using bedrails?: A Little Help needed moving from lying on your back to sitting on the side of a flat bed without using bedrails?: A Lot Help needed moving to and from a bed to a chair (including a wheelchair)?: A Little Help needed standing up from a chair using your arms (e.g., wheelchair or bedside chair)?: A Little Help needed to walk in hospital room?: A Little Help needed climbing 3-5 steps with a railing? : A Lot 6 Click Score: 16    End of Session Equipment Utilized During Treatment: Gait belt Activity Tolerance: Patient tolerated treatment well Patient left: in chair;with call bell/phone within reach;with chair alarm set;with family/visitor present Nurse Communication: Mobility status PT Visit Diagnosis: Muscle weakness (generalized) (M62.81);Difficulty in walking, not elsewhere classified (R26.2);History of falling (Z91.81);Pain Pain - Right/Left: Right Pain - part of body: Hip    Time: 9323-5573 PT Time Calculation (min) (ACUTE ONLY): 36 min   Charges:              Desiree Hane SPT 04/19/21, 5:36 PM

## 2021-04-20 DIAGNOSIS — R531 Weakness: Secondary | ICD-10-CM

## 2021-04-20 MED ORDER — ENOXAPARIN SODIUM 60 MG/0.6ML IJ SOSY: PREFILLED_SYRINGE | INTRAMUSCULAR | 0 refills | Status: DC

## 2021-04-20 MED ORDER — DABIGATRAN ETEXILATE MESYLATE 150 MG PO CAPS: ORAL_CAPSULE | ORAL | 0 refills | Status: DC

## 2021-04-20 NOTE — Plan of Care (Signed)

## 2021-04-20 NOTE — TOC Progression Note (Signed)
Transition of Care [facility]) - Progression Note    Patient Details  Name: Anne Harrison MRN: [id number] Date of Birth: January 17, 1924  [facility]) CM/SW Contact  Liliana Cline, LCSW Phone Number: 04/20/2021, 9:03 AM  Clinical Narrative:     Spoke to Anne Harrison at [facility]. Anne Harrison requested records and to confirm that daughter will administer Lovenox twice a day. CSW sent notes to [place]. Also called patient's daughter Anne Harrison who confirmed she will administer Lovenox, per MD plan to switch to Pradaxa on Sunday. Anne Harrison reported she will need to review records which will be after 10:30 this morning. She will then call CSW back and let CSW know for sure if they can take patient back today. Anne Harrison stated if patient discharges today daughter will have to transport. Updated RN and MD.    Expected Discharge Plan: Skilled Nursing Facility Barriers to Discharge: Continued Medical Work up  Expected Discharge Plan and Services Expected Discharge Plan: Skilled Nursing Facility   Discharge Planning Services: CM Consult   Living arrangements for the past 2 months: Skilled Nursing Facility                                       Social Determinants of Health (SDOH) Interventions    Readmission Risk Interventions No flowsheet data found.

## 2021-04-20 NOTE — [place] FL2 (Addendum)
[place] MEDICAID FL2 LEVEL OF CARE SCREENING TOOL     IDENTIFICATION  Patient Name: Anne Harrison Birthdate: 1924-02-17 Sex: female Admission Date (Current Location): 04/17/2021  [facility] and [place] Number:  Chiropodist and Address:  [facility], [address], [place], [place] [address]      Provider Number: [id number]  Attending Physician Name and Address:  Alford Highland, MD  Relative Name and Phone Number:  Wendie Agreste (daughter) [phone]    Current Level of Care: Hospital Recommended Level of Care: Assisted Living Facility Prior Approval Number:    Date Approved/Denied:   PASRR Number: [id number]  Discharge Plan:      Current Diagnoses: Patient Active Problem List   Diagnosis Date Noted   Recurrent acute deep vein thrombosis (DVT) of right lower extremity (HCC) 04/18/2021   Pulmonary embolism (HCC) 04/02/2021   DVT (deep venous thrombosis) (HCC)_right leg 04/02/2021   COVID-19 virus infection 04/02/2021   Chronic obstructive pulmonary disease (HCC)    Onychomycosis 10/22/2017   Essential hypertension 10/07/2017    Orientation RESPIRATION BLADDER Height & Weight     Self, Situation, Place, Time  Normal External catheter Weight: 114 lb 10.2 oz (52 kg) Height:  4\' 11"  (149.9 cm)  BEHAVIORAL SYMPTOMS/MOOD NEUROLOGICAL BOWEL NUTRITION STATUS      Continent Diet (normal diet)  AMBULATORY STATUS COMMUNICATION OF NEEDS Skin   Supervision Verbally Skin abrasions, Bruising (legs)                       Personal Care Assistance Level of Assistance  Bathing, Feeding, Dressing Bathing Assistance: Limited assistance Feeding assistance: Independent Dressing Assistance: Limited assistance     Functional Limitations Info  Hearing   Hearing Info: Impaired      SPECIAL CARE FACTORS FREQUENCY  PT (By licensed PT)     PT Frequency: home health PT              Contractures Contractures Info: Not present     Additional Factors Info  Code Status, Allergies Code Status Info: DNR Allergies Info: Ciprofloxacin           Medication List       STOP taking these medications     apixaban 5 MG Tabs tablet Commonly known as: ELIQUIS           TAKE these medications     acetaminophen 325 MG tablet Commonly known as: TYLENOL Take 325 mg by mouth every 4 (four) hours as needed for mild pain. What changed: Another medication with the same name was removed. Continue taking this medication, and follow the directions you see here.    albuterol 108 (90 Base) MCG/ACT inhaler Commonly known as: VENTOLIN HFA Inhale 1 puff into the lungs 2 (two) times daily.    amLODipine 5 MG tablet Commonly known as: NORVASC Take 5 mg by mouth daily.    ascorbic acid 500 MG tablet Commonly known as: VITAMIN C Take 1,000 mg by mouth daily.    benzocaine 10 % mucosal gel Commonly known as: ORAJEL Use as directed 1 application in the mouth or throat every 4 (four) hours as needed for mouth pain.    dabigatran 150 MG Caps capsule Commonly known as: PRADAXA One tablet twice a day starting Sunday 04/22/2021 in the morning    enoxaparin 60 MG/0.6ML injection Commonly known as: LOVENOX Administer 50mg  subcutaneous injection twice a day for three doses    furosemide 40 MG tablet Commonly known  as: LASIX Take 40 mg by mouth daily. What changed: Another medication with the same name was removed. Continue taking this medication, and follow the directions you see here.    metoprolol succinate 25 MG 24 hr tablet Commonly known as: TOPROL-XL Take 25 mg by mouth at bedtime.    Oyster Shell Calcium 500-400 MG-UNIT Tabs Take 1 tablet by mouth daily.    potassium chloride SA 20 MEQ tablet Commonly known as: KLOR-CON Take 20 mEq by mouth daily. What changed: Another medication with the same name was removed. Continue taking this medication, and follow the directions you see here.      Relevant Imaging  Results:  Relevant Lab Results:   Additional Information SS# [id number]  Hassan Blackshire E Kyomi Hector, LCSW

## 2021-04-20 NOTE — TOC Transition Note (Signed)
Transition of Care [facility]) - CM/SW Discharge Note   Patient Details  Name: Itzia Cunliffe MRN: [id number] Date of Birth: 16-Mar-1924  [facility] Care [facility]) CM/SW Contact:  Liliana Cline, LCSW Phone Number: 04/20/2021, 11:51 AM   Clinical Narrative:   Confirmed with Harriett Sine at [facility] that patient can discharge there today. Daughter to transport. Updated MD and RN. Sent updated FL2 and DC Summary to [place]. She denied additional TOC needs.     Final next level of care: Assisted Living Barriers to Discharge: Barriers Resolved   Patient Goals and CMS Choice Patient states their goals for this hospitalization and ongoing recovery are:: ALF CMS Medicare.gov Compare Post Acute Care list provided to:: Patient Represenative (must comment) Choice offered to / list presented to : Adult Children  Discharge Placement              Patient chooses bed at:  [facility]) Patient to be transferred to facility by: Chip Boer- daughter   Patient and family notified of of transfer: 04/20/21  Discharge Plan and Services   Discharge Planning Services: CM Consult                                 Social Determinants of Health (SDOH) Interventions     Readmission Risk Interventions No flowsheet data found.

## 2021-04-20 NOTE — Discharge Summary (Signed)
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  Patient had a recent vascular procedure and was discharged on Eliquis.  Patient came back to the hospital with recurrent swelling.  Was started on heparin drip.  Converted over to Lovenox injections.  I will consider this a treatment failure of Eliquis.  We will give Lovenox injections twice a day through Saturday, 04/21/2021.  The patient's daughter will administer this.  Pradaxa will be started on Sunday, 04/22/2021 and 30-day coupon will be given by pharmacist.  He vascular team did an IVC filter.  Follow-up with vascular as outpatient.  CT scan abdomen pelvis does not show any cancerous process. Essential hypertension on amlodipine, Lasix and metoprolol History of COPD on as needed inhaler Weakness.  Physical therapy recommended going back to the assisted living.  Home health PT  DISCHARGE CONDITIONS:   Satisfactory  CONSULTS OBTAINED:    Vascular surgery  DRUG ALLERGIES:   Allergies  Allergen Reactions   Ciprofloxacin Hives    DISCHARGE  MEDICATIONS:   Allergies as of 04/20/2021       Reactions   Ciprofloxacin Hives        Medication List     STOP taking these medications    apixaban 5 MG Tabs tablet Commonly known as: ELIQUIS       TAKE these medications    acetaminophen 325 MG tablet Commonly known as: TYLENOL Take 325 mg by mouth every 4 (four) hours as needed for mild pain. What changed: Another medication with the same name was removed. Continue taking this medication, and follow the directions you see here.   albuterol 108 (90 Base) MCG/ACT inhaler Commonly known as: VENTOLIN HFA Inhale 1 puff into the lungs 2 (two) times daily.   amLODipine 5 MG tablet Commonly known as: NORVASC Take 5 mg by mouth daily.   ascorbic acid 500 MG tablet Commonly known as: VITAMIN C Take 1,000 mg by mouth daily.   benzocaine 10 % mucosal gel Commonly known as: ORAJEL Use as directed 1 application in the mouth or throat every 4 (four) hours as needed for mouth pain.   dabigatran 150 MG Caps capsule Commonly known as: PRADAXA One tablet twice a day starting Sunday 04/22/2021 in the morning   enoxaparin 60 MG/0.6ML injection Commonly known as: LOVENOX Administer 50mg  subcutaneous injection twice a day for three doses   furosemide 40 MG tablet Commonly known as: LASIX Take 40 mg by mouth daily. What changed: Another medication with the same name was removed. Continue taking this medication, and follow the directions you see here.  metoprolol succinate 25 MG 24 hr tablet Commonly known as: TOPROL-XL Take 25 mg by mouth at bedtime.   Oyster Shell Calcium 500-400 MG-UNIT Tabs Take 1 tablet by mouth daily.   potassium chloride SA 20 MEQ tablet Commonly known as: KLOR-CON Take 20 mEq by mouth daily. What changed: Another medication with the same name was removed. Continue taking this medication, and follow the directions you see here.         DISCHARGE INSTRUCTIONS:   Follow-up PMD 5  days Follow-up vascular surgery as outpatient  If you experience worsening of your admission symptoms, develop shortness of breath, life threatening emergency, suicidal or homicidal thoughts you must seek medical attention immediately by calling 911 or calling your MD immediately  if symptoms less severe.  You Must read complete instructions/literature along with all the possible adverse reactions/side effects for all the Medicines you take and that have been prescribed to you. Take any new Medicines after you have completely understood and accept all the possible adverse reactions/side effects.   Please note  You were cared for by a hospitalist during your hospital stay. If you have any questions about your discharge medications or the care you received while you were in the hospital after you are discharged, you can call the unit and asked to speak with the hospitalist on call if the hospitalist that took care of you is not available. Once you are discharged, your primary care physician will handle any further medical issues. Please note that NO REFILLS for any discharge medications will be authorized once you are discharged, as it is imperative that you return to your primary care physician (or establish a relationship with a primary care physician if you do not have one) for your aftercare needs so that they can reassess your need for medications and monitor your lab values.    Today   CHIEF COMPLAINT:   Chief Complaint  Patient presents with   Leg Swelling   Leg Pain    HISTORY OF PRESENT ILLNESS:  Anne Harrison  is a 87 y.o. female with a known history of came in with leg swelling and pain and found to have recurrent DVT    VITAL SIGNS:  Blood pressure (!) 149/65, pulse 60, temperature 98.1 F (36.7 C), temperature source Oral, resp. rate 18, height 4\' 11"  (1.499 m), weight 52 kg, SpO2 96 %.  I/O:   Intake/Output Summary (Last 24 hours) at 04/20/2021 1053 Last data filed at  04/20/2021 0900 Gross per 24 hour  Intake 120 ml  Output 800 ml  Net -680 ml    PHYSICAL EXAMINATION:  GENERAL:  87 y.o.-year-old patient lying in the bed with no acute distress.  EYES: Pupils equal, round, reactive to light and accommodation. No scleral icterus. Extraocular muscles intact.  HEENT: Head atraumatic, normocephalic. Oropharynx and nasopharynx clear.  LUNGS: Normal breath sounds bilaterally, no wheezing, rales,rhonchi or crepitation. No use of accessory muscles of respiration.  CARDIOVASCULAR: S1, S2 normal. No murmurs, rubs, or gallops.  ABDOMEN: Soft, non-tender.  EXTREMITIES: Right leg swelling has decreased from admission.  NEUROLOGIC: Cranial nerves II through XII are intact. Muscle strength 5/5 in all extremities. Sensation intact. Gait not checked.  PSYCHIATRIC: The patient is alert and oriented x 3.  SKIN: No obvious rash, lesion, or ulcer.   DATA REVIEW:   CBC Recent Labs  Lab 04/19/21 0206  WBC 6.4  HGB 11.0*  HCT 33.6*  PLT 169    Chemistries  Recent Labs  Lab  04/17/21 2234 04/18/21 0402 04/19/21 0206  NA 137   < > 137  K 3.9   < > 3.8  CL 98   < > 104  CO2 29   < > 28  GLUCOSE 91   < > 88  BUN 13   < > 9  CREATININE 0.77   < > 0.66  CALCIUM 8.7*   < > 8.6*  AST 13*  --   --   ALT 7  --   --   ALKPHOS 80  --   --   BILITOT 0.6  --   --    < > = values in this interval not displayed.    Microbiology Results  Results for orders placed or performed during the hospital encounter of 04/17/21  Resp Panel by RT-PCR (Flu A&B, Covid) Nasopharyngeal Swab     Status: None   Collection Time: 04/18/21 12:03 AM   Specimen: Nasopharyngeal Swab; Nasopharyngeal(NP) swabs in vial transport medium  Result Value Ref Range Status   SARS Coronavirus 2 by RT PCR NEGATIVE NEGATIVE Final    Comment: (NOTE) SARS-CoV-2 target nucleic acids are NOT DETECTED.  The SARS-CoV-2 RNA is generally detectable in upper respiratory specimens during the acute phase of  infection. The lowest concentration of SARS-CoV-2 viral copies this assay can detect is 138 copies/mL. A negative result does not preclude SARS-Cov-2 infection and should not be used as the sole basis for treatment or other patient management decisions. A negative result may occur with  improper specimen collection/handling, submission of specimen other than nasopharyngeal swab, presence of viral mutation(s) within the areas targeted by this assay, and inadequate number of viral copies(<138 copies/mL). A negative result must be combined with clinical observations, patient history, and epidemiological information. The expected result is Negative.  Fact Sheet for Patients:  BloggerCourse.com  Fact Sheet for Healthcare Providers:  SeriousBroker.it  This test is no t yet approved or cleared by the [place] FDA and  has been authorized for detection and/or diagnosis of SARS-CoV-2 by FDA under an Emergency Use Authorization (EUA). This EUA will remain  in effect (meaning this test can be used) for the duration of the COVID-19 declaration under Section 564(b)(1) of the Act, 21 U.S.C.section 360bbb-3(b)(1), unless the authorization is terminated  or revoked sooner.       Influenza A by PCR NEGATIVE NEGATIVE Final   Influenza B by PCR NEGATIVE NEGATIVE Final    Comment: (NOTE) The Xpert Xpress SARS-CoV-2/FLU/RSV plus assay is intended as an aid in the diagnosis of influenza from Nasopharyngeal swab specimens and should not be used as a sole basis for treatment. Nasal washings and aspirates are unacceptable for Xpert Xpress SARS-CoV-2/FLU/RSV testing.  Fact Sheet for Patients: BloggerCourse.com  Fact Sheet for Healthcare Providers: SeriousBroker.it  This test is not yet approved or cleared by the [place] FDA and has been authorized for detection and/or diagnosis of SARS-CoV-2  by FDA under an Emergency Use Authorization (EUA). This EUA will remain in effect (meaning this test can be used) for the duration of the COVID-19 declaration under Section 564(b)(1) of the Act, 21 U.S.C. section 360bbb-3(b)(1), unless the authorization is terminated or revoked.  Performed at [facility], [address] [place], [place] [address]     RADIOLOGY:  CT ABDOMEN PELVIS WO CONTRAST  Result Date: 04/19/2021 CLINICAL DATA:  Weight loss. Recurrent deep vein thrombosis of the RIGHT LOWER extremity. EXAM: CT ABDOMEN AND PELVIS WITHOUT CONTRAST TECHNIQUE: Multidetector CT imaging of  the abdomen and pelvis was performed following the standard protocol without IV contrast. COMPARISON:  CT angio chest on 04/02/2021 FINDINGS: Lower chest: Large hiatal hernia present. Coronary artery calcifications are present. Hepatobiliary: Ventriculoperitoneal shunt is coiled in the RIGHT UPPER QUADRANT adjacent to the liver. Course of the catheter is unremarkable. The liver is homogeneous. Status post cholecystectomy. Pancreas: Atrophic, atrophic pancreas without mass. Spleen: Normal in size without focal abnormality. Adrenals/Urinary Tract: Normal appearance of the adrenal glands. Benign LEFT renal cyst is 2.0 centimeters. There is extra renal pelvis bilaterally, RIGHT greater than LEFT. No intrarenal calculi or ureteral stones. The bladder and visualized portion of the urethra are normal. Stomach/Bowel: Large hiatal hernia. Stomach is otherwise normal in appearance. The small bowel loops are unremarkable. There is a large amount of stool throughout loops of colon. Scattered colonic diverticula are present. No evidence for acute diverticulitis. Surgical clips are present in the RIGHT LOWER QUADRANT. Vascular/Lymphatic: Inferior vena cava filter is in place, apex at L2. There is dense atherosclerotic calcification of the abdominal aorta. Saccular aneurysm of the infrarenal aorta is 3.4 centimeters.  Reproductive: Hysterectomy. Other: Anterior abdominal wall is unremarkable. There is stranding/edema in the RIGHT inguinal region consistent with recent venous access for IVC filter. Musculoskeletal: Remote RIGHT inferior pubic ramus fracture. There are chronic superior endplate fractures of T11 and T12. IMPRESSION: 1. No acute abnormality of the abdomen or pelvis. 2. Large hiatal hernia. 3. Unremarkable appearance of ventriculoperitoneal shunt in the RIGHT UPPER QUADRANT. 4. Prior cholecystectomy, appendectomy, and hysterectomy. 5. Colonic diverticulosis without acute diverticulitis. 6.  Aortic atherosclerosis.  (ICD10-I70.0) 7. Aortic aneurysm NOS (ICD10-I71.9). Saccular infrarenal abdominal aortic aneurysm measuring 3.4 centimeters. Recommend referral to a vascular specialist. This recommendation follows ACR consensus guidelines: White Paper of the ACR Incidental Findings Committee II on Vascular Findings. J Am Coll Radiol 2013; 10:789-794. 8. Recent placement of inferior vena cava filter. Electronically Signed   By: Norva Pavlov M.D.   On: 04/19/2021 15:59   PERIPHERAL VASCULAR CATHETERIZATION  Result Date: 04/18/2021 See surgical note for result.     Management plans discussed with the patient, family and they are in agreement.  CODE STATUS:     Code Status Orders  (From admission, onward)           Start     Ordered   04/18/21 0100  Do not attempt resuscitation (DNR)  Continuous       Question Answer Comment  In the event of cardiac or respiratory ARREST Do not call a "code blue"   In the event of cardiac or respiratory ARREST Do not perform Intubation, CPR, defibrillation or ACLS   In the event of cardiac or respiratory ARREST Use medication by any route, position, wound care, and other measures to relive pain and suffering. May use oxygen, suction and manual treatment of airway obstruction as needed for comfort.      04/18/21 0100           Code Status History     Date  Active Date Inactive Code Status Order ID Comments User Context   04/02/2021 1050 04/05/2021 1945 DNR [id number]  Lorretta Harp, MD ED   04/02/2021 0820 04/02/2021 1050 DNR [id number]  Gilles Chiquito, MD ED      Advance Directive Documentation    Flowsheet Row Most Recent Value  Type of Advance Directive Out of facility DNR (pink MOST or yellow form)  Pre-existing out of facility DNR order (yellow form or pink MOST form) Pink Most/Yellow  Form available - Physician notified to receive inpatient order  "MOST" Form in Place? --       TOTAL TIME TAKING CARE OF THIS PATIENT: 35 minutes.    Alford Highland M.D on 04/20/2021 at 10:53 AM  Between 7am to 6pm - Pager - [phone]  After 6pm go to [email or web address] - password [email or web address]  [facility]  CC: Primary care physician; [facility]

## 2021-04-20 NOTE — Care Management Important Message (Signed)
Important Message  Patient Details  Name: Anne Harrison MRN: [id number] Date of Birth: [birth year]   Medicare Important Message Given:  Yes     Johnell Comings 04/20/2021, 11:15 AM

## 2021-05-02 ENCOUNTER — Other Ambulatory Visit (INDEPENDENT_AMBULATORY_CARE_PROVIDER_SITE_OTHER): Payer: Self-pay | Admitting: Vascular Surgery

## 2021-05-02 DIAGNOSIS — I82401 Acute embolism and thrombosis of unspecified deep veins of right lower extremity: Secondary | ICD-10-CM

## 2021-05-02 DIAGNOSIS — Z95828 Presence of other vascular implants and grafts: Secondary | ICD-10-CM

## 2021-05-03 ENCOUNTER — Encounter (INDEPENDENT_AMBULATORY_CARE_PROVIDER_SITE_OTHER): Payer: Self-pay | Admitting: Vascular Surgery

## 2021-05-03 ENCOUNTER — Ambulatory Visit (INDEPENDENT_AMBULATORY_CARE_PROVIDER_SITE_OTHER): Payer: Medicare Other | Admitting: Vascular Surgery

## 2021-05-03 ENCOUNTER — Other Ambulatory Visit: Payer: Self-pay

## 2021-05-03 ENCOUNTER — Ambulatory Visit (INDEPENDENT_AMBULATORY_CARE_PROVIDER_SITE_OTHER): Payer: Medicare Other

## 2021-05-03 VITALS — BP 165/72 | HR 60 | Resp 15 | Wt 112.0 lb

## 2021-05-03 DIAGNOSIS — I824Z1 Acute embolism and thrombosis of unspecified deep veins of right distal lower extremity: Secondary | ICD-10-CM | POA: Diagnosis not present

## 2021-05-03 DIAGNOSIS — Z95828 Presence of other vascular implants and grafts: Secondary | ICD-10-CM | POA: Diagnosis not present

## 2021-05-03 DIAGNOSIS — I10 Essential (primary) hypertension: Secondary | ICD-10-CM | POA: Diagnosis not present

## 2021-05-03 DIAGNOSIS — J449 Chronic obstructive pulmonary disease, unspecified: Secondary | ICD-10-CM

## 2021-05-03 DIAGNOSIS — U071 COVID-19: Secondary | ICD-10-CM

## 2021-05-03 DIAGNOSIS — I82401 Acute embolism and thrombosis of unspecified deep veins of right lower extremity: Secondary | ICD-10-CM | POA: Diagnosis not present

## 2021-05-03 DIAGNOSIS — I2782 Chronic pulmonary embolism: Secondary | ICD-10-CM

## 2021-05-03 NOTE — Progress Notes (Signed)
MRN : [id number]  Anne Harrison is a 87 y.o. (04-12-1924) female who presents with chief complaint of No chief complaint on file. Marland Kitchen  History of Present Illness:  Patient returns for follow-up status post deep vein thrombosis of the right lower extremity  PROCEDURE 04/03/21: 1.   US guidance for vascular access to right popliteal vein 2.   Catheter placement into right common iliac vein from right popliteal approach 3.   IVC gram and right lower extremity venogram 4.   Catheter directed thrombolysis with 8 mg of tPA to the superficial femoral, common femoral external iliac and common iliac veins 5.   Mechanical thrombectomy of the right superficial femoral common femoral external iliac and common iliac veins 6.   PTA of right superficial femoral common femoral external iliac and common iliac veins with 8 mm x 100 mm [place] balloon  Anne Harrison is a 87 y.o. female who presents with failure of anticoagulation and worsening DVT after previous intervention.  Inferior vena cava filter is indicated for this reason.  PROCEDURE 04/18/2021: 1.   Ultrasound guidance for vascular access to the right vein 2.   Catheter placement into the inferior vena cava 3.   Inferior venacavogram 4.   Placement of a Bard Denali IVC filter The patient presents to the office for evaluation of DVT.  DVT was identified at [facility] by Duplex ultrasound.  The initial symptoms were pain and swelling in the lower extremity.  The patient notes her leg continues to be minimally painful with dependency and swells somewhat but her swelling is much better overall.  Symptoms are much better with elevation.  The patient notes minimal edema in the morning which steadily worsens throughout the day.    The patient has not been using compression therapy at this point.  No SOB or pleuritic chest pains.  No cough or hemoptysis.  No blood per rectum or blood in any sputum.  No excessive bruising per the patient.   Todays duplex  ultrasound shows increased recanalization of the popliteal and SFV.  No outpatient medications have been marked as taking for the 05/03/21 encounter (Appointment) with Gilda Crease, Latina Craver, MD.    Past Medical History:  Diagnosis Date   Bronchitis, not specified as acute or chronic    Essential (primary) hypertension    Other emphysema (HCC)    Tinea unguium    Urge incontinence     Past Surgical History:  Procedure Laterality Date   ABDOMINAL HYSTERECTOMY     CHOLECYSTECTOMY     IVC FILTER INSERTION N/A 04/18/2021   Procedure: IVC FILTER INSERTION;  Surgeon: Annice Needy, MD;  Location: [facility] INVASIVE CV LAB;  Service: Cardiovascular;  Laterality: N/A;   PERIPHERAL VASCULAR THROMBECTOMY Right 04/03/2021   Procedure: PERIPHERAL VASCULAR THROMBECTOMY;  Surgeon: Renford Dills, MD;  Location: [facility] INVASIVE CV LAB;  Service: Cardiovascular;  Laterality: Right;    Social History Social History   Tobacco Use   Smoking status: Former    Pack years: 0.00   Smokeless tobacco: Never  Vaping Use   Vaping Use: Never used  Substance Use Topics   Alcohol use: Not Currently    Alcohol/week: 1.0 - 2.0 standard drink    Types: 1 - 2 Glasses of wine per week    Comment: 1-2 glasses of wine per day   Drug use: No    Family History Family History  Problem Relation Age of Onset   Hypertension Mother    Heart attack  Father     Allergies  Allergen Reactions   Ciprofloxacin Hives     REVIEW OF SYSTEMS (Negative unless checked)  Constitutional: [] Weight loss  [] Fever  [] Chills Cardiac: [] Chest pain   [] Chest pressure   [] Palpitations   [] Shortness of breath when laying flat   [] Shortness of breath with exertion. Vascular:  [] Pain in legs with walking   [] Pain in legs at rest  [x] History of DVT   [] Phlebitis   [x] Swelling in legs   [] Varicose veins   [] Non-healing ulcers Pulmonary:   [] Uses home oxygen   [] Productive cough   [] Hemoptysis   [] Wheeze  [] COPD   [] Asthma Neurologic:   [] Dizziness   [] Seizures   [] History of stroke   [] History of TIA  [] Aphasia   [] Vissual changes   [] Weakness or numbness in arm   [] Weakness or numbness in leg Musculoskeletal:   [] Joint swelling   [x] Joint pain   [x] Low back pain Hematologic:  [] Easy bruising  [] Easy bleeding   [] Hypercoagulable state   [] Anemic Gastrointestinal:  [] Diarrhea   [] Vomiting  [] Gastroesophageal reflux/heartburn   [] Difficulty swallowing. Genitourinary:  [] Chronic kidney disease   [] Difficult urination  [] Frequent urination   [] Blood in urine Skin:  [] Rashes   [] Ulcers  Psychological:  [] History of anxiety   []  History of major depression.  Physical Examination  There were no vitals filed for this visit. There is no height or weight on file to calculate BMI. Gen: WD/WN, NAD Head: [place]/AT, No temporalis wasting.  Ear/Nose/Throat: Hearing grossly intact, nares w/o erythema or drainage Eyes: PER, EOMI, sclera nonicteric.  Neck: Supple, no large masses.   Pulmonary:  Good air movement, no audible wheezing bilaterally, no use of accessory muscles.  Cardiac: RRR, no JVD Vascular:  scattered varicosities present bilaterally.  Mild venous stasis changes to the legs bilaterally.  Right leg 2+ firm pitting edema  Vessel Right Left  Radial Palpable Palpable  Gastrointestinal: Non-distended. No guarding/no peritoneal signs.  Musculoskeletal: M/S 5/5 throughout.  No deformity or atrophy.  Neurologic: CN 2-12 intact. Symmetrical.  Speech is fluent. Motor exam as listed above. Psychiatric: Judgment intact, Mood & affect appropriate for pt's clinical situation. Dermatologic: Venous rashes or ulcers noted.  No changes consistent with cellulitis.   CBC Lab Results  Component Value Date   WBC 6.4 04/19/2021   HGB 11.0 (L) 04/19/2021   HCT 33.6 (L) 04/19/2021   MCV 93.1 04/19/2021   PLT 169 04/19/2021    BMET    Component Value Date/Time   NA 137 04/19/2021 0206   K 3.8 04/19/2021 0206   CL 104 04/19/2021 0206    CO2 28 04/19/2021 0206   GLUCOSE 88 04/19/2021 0206   BUN 9 04/19/2021 0206   CREATININE 0.66 04/19/2021 0206   CALCIUM 8.6 (L) 04/19/2021 0206   GFRNONAA >60 04/19/2021 0206   GFRAA >60 07/11/2020 1245   CrCl cannot be calculated (Unknown ideal weight.).  COAG Lab Results  Component Value Date   INR 1.6 (H) 04/17/2021   INR 1.1 04/02/2021   INR 0.95 11/27/2018    Radiology CT ABDOMEN PELVIS WO CONTRAST  Result Date: 04/19/2021 CLINICAL DATA:  Weight loss. Recurrent deep vein thrombosis of the RIGHT LOWER extremity. EXAM: CT ABDOMEN AND PELVIS WITHOUT CONTRAST TECHNIQUE: Multidetector CT imaging of the abdomen and pelvis was performed following the standard protocol without IV contrast. COMPARISON:  CT angio chest on 04/02/2021 FINDINGS: Lower chest: Large hiatal hernia present. Coronary artery calcifications are present. Hepatobiliary: Ventriculoperitoneal shunt is coiled  in the RIGHT UPPER QUADRANT adjacent to the liver. Course of the catheter is unremarkable. The liver is homogeneous. Status post cholecystectomy. Pancreas: Atrophic, atrophic pancreas without mass. Spleen: Normal in size without focal abnormality. Adrenals/Urinary Tract: Normal appearance of the adrenal glands. Benign LEFT renal cyst is 2.0 centimeters. There is extra renal pelvis bilaterally, RIGHT greater than LEFT. No intrarenal calculi or ureteral stones. The bladder and visualized portion of the urethra are normal. Stomach/Bowel: Large hiatal hernia. Stomach is otherwise normal in appearance. The small bowel loops are unremarkable. There is a large amount of stool throughout loops of colon. Scattered colonic diverticula are present. No evidence for acute diverticulitis. Surgical clips are present in the RIGHT LOWER QUADRANT. Vascular/Lymphatic: Inferior vena cava filter is in place, apex at L2. There is dense atherosclerotic calcification of the abdominal aorta. Saccular aneurysm of the infrarenal aorta is 3.4  centimeters. Reproductive: Hysterectomy. Other: Anterior abdominal wall is unremarkable. There is stranding/edema in the RIGHT inguinal region consistent with recent venous access for IVC filter. Musculoskeletal: Remote RIGHT inferior pubic ramus fracture. There are chronic superior endplate fractures of T11 and T12. IMPRESSION: 1. No acute abnormality of the abdomen or pelvis. 2. Large hiatal hernia. 3. Unremarkable appearance of ventriculoperitoneal shunt in the RIGHT UPPER QUADRANT. 4. Prior cholecystectomy, appendectomy, and hysterectomy. 5. Colonic diverticulosis without acute diverticulitis. 6.  Aortic atherosclerosis.  (ICD10-I70.0) 7. Aortic aneurysm NOS (ICD10-I71.9). Saccular infrarenal abdominal aortic aneurysm measuring 3.4 centimeters. Recommend referral to a vascular specialist. This recommendation follows ACR consensus guidelines: White Paper of the ACR Incidental Findings Committee II on Vascular Findings. J Am Coll Radiol 2013; 10:789-794. 8. Recent placement of inferior vena cava filter. Electronically Signed   By: Norva Pavlov M.D.   On: 04/19/2021 15:59   PERIPHERAL VASCULAR CATHETERIZATION  Result Date: 04/18/2021 See surgical note for result.  PERIPHERAL VASCULAR CATHETERIZATION  Result Date: 04/03/2021 See surgical note for result.  US Venous Img Lower Unilateral Right  Result Date: 04/18/2021 CLINICAL DATA:  Right lower extremity swelling EXAM: RIGHT LOWER EXTREMITY VENOUS DOPPLER ULTRASOUND TECHNIQUE: Gray-scale sonography with graded compression, as well as color Doppler and duplex ultrasound were performed to evaluate the lower extremity deep venous systems from the level of the common femoral vein and including the common femoral, femoral, profunda femoral, popliteal and calf veins including the posterior tibial, peroneal and gastrocnemius veins when visible. The superficial great saphenous vein was also interrogated. Spectral Doppler was utilized to evaluate flow at rest  and with distal augmentation maneuvers in the common femoral, femoral and popliteal veins. COMPARISON:  04/02/2021 FINDINGS: Contralateral Common Femoral Vein: Respiratory phasicity is normal and symmetric with the symptomatic side. No evidence of thrombus. Normal compressibility. Common Femoral Vein: Thrombus is noted with decreased compressibility. Saphenofemoral Junction: Thrombus is noted with decreased compressibility. Profunda Femoral Vein: Thrombus is noted with decreased compressibility. Femoral Vein: Thrombus is noted with decreased compressibility. Popliteal Vein: Thrombus is noted with decreased compressibility. Calf Veins: Thrombus is noted with decreased compressibility. Superficial Great Saphenous Vein: Thrombus is noted with decreased compressibility from the mid thigh to the saphenofemoral junction. Venous Reflux:  None. Other Findings:  None. IMPRESSION: Diffuse deep venous thrombosis in the right leg similar to that seen on prior exam. Electronically Signed   By: Alcide Clever M.D.   On: 04/18/2021 00:00     Assessment/Plan 1. Acute deep vein thrombosis (DVT) of distal vein of right lower extremity (HCC) Recommend:   No surgery or intervention at this point in time.  IVC filter is in position and removal will be discussed in about 3 months.  Patient's duplex ultrasound of the venous system shows DVT from the popliteal to the femoral veins.  The patient is initiated on anticoagulation but has been switched to Pradaxa from Eliquis.  After discussion with patient and daughter they would like a second opinion about the best anticoagulant (I feel there was not a true failure and that given her age there are advantages to Eliquis) so I will ask Hematology to assess.  Elevation was stressed, use of a recliner was discussed.  I have had a long discussion with the patient regarding DVT and post phlebitic changes such as swelling and why it  causes symptoms such as pain.  The patient will wear  graduated compression stockings class 1 (20-30 mmHg), beginning after three full days of anticoagulation, on a daily basis a prescription was given. The patient will  beginning wearing the stockings first thing in the morning and removing them in the evening. The patient is instructed specifically not to sleep in the stockings.  In addition, behavioral modification including elevation during the day and avoidance of prolonged dependency will be initiated.    The patient will continue anticoagulation for now as there have not been any problems or complications at this point.    - VAS US LOWER EXTREMITY VENOUS (DVT); Future - Ambulatory referral to Hematology / Oncology  2. Chronic pulmonary embolism without acute cor pulmonale, unspecified pulmonary embolism type (HCC) See #1    3. Chronic obstructive pulmonary disease, unspecified COPD type (HCC) Continue pulmonary medications and aerosols as already ordered, these medications have been reviewed and there are no changes at this time.    4. Essential hypertension Continue antihypertensive medications as already ordered, these medications have been reviewed and there are no changes at this time.   5. COVID-19 virus infection Likely the etiology of her thrombotic event.    Levora DredgeGregory Monta Maiorana, MD  05/03/2021 8:27 AM

## 2021-05-04 ENCOUNTER — Telehealth (INDEPENDENT_AMBULATORY_CARE_PROVIDER_SITE_OTHER): Payer: Self-pay | Admitting: Vascular Surgery

## 2021-05-04 NOTE — Telephone Encounter (Signed)
Daughter called stated that she would like her mother to come off of Pradaxa and placed back on Eliquis if possible. Patient was last seen 05/03/21. Please advise.

## 2021-05-05 ENCOUNTER — Encounter (INDEPENDENT_AMBULATORY_CARE_PROVIDER_SITE_OTHER): Payer: Self-pay | Admitting: Vascular Surgery

## 2021-05-07 NOTE — Telephone Encounter (Signed)
Per the discussion that took place with Dr. Gilda Crease on Thursday, he would like a second opinion and evaluation by the hematologist.  Therefore his recommendation is that there are no medication changes made, until you have seen and discussed with hematology.

## 2021-05-08 NOTE — Telephone Encounter (Signed)
Lvm on Daughter's phone and made her aware of Np's note.

## 2021-05-11 ENCOUNTER — Other Ambulatory Visit: Payer: Self-pay

## 2021-05-11 ENCOUNTER — Inpatient Hospital Stay: Payer: Medicare Other | Attending: Nurse Practitioner | Admitting: Nurse Practitioner

## 2021-05-11 ENCOUNTER — Inpatient Hospital Stay: Payer: Medicare Other

## 2021-05-11 VITALS — BP 146/69 | HR 81 | Temp 97.2°F | Wt 110.0 lb

## 2021-05-11 DIAGNOSIS — Z95828 Presence of other vascular implants and grafts: Secondary | ICD-10-CM | POA: Diagnosis not present

## 2021-05-11 DIAGNOSIS — I82401 Acute embolism and thrombosis of unspecified deep veins of right lower extremity: Secondary | ICD-10-CM | POA: Insufficient documentation

## 2021-05-11 DIAGNOSIS — Z87891 Personal history of nicotine dependence: Secondary | ICD-10-CM | POA: Diagnosis not present

## 2021-05-11 DIAGNOSIS — Z7901 Long term (current) use of anticoagulants: Secondary | ICD-10-CM | POA: Diagnosis not present

## 2021-05-11 DIAGNOSIS — R5383 Other fatigue: Secondary | ICD-10-CM | POA: Insufficient documentation

## 2021-05-11 DIAGNOSIS — Z79899 Other long term (current) drug therapy: Secondary | ICD-10-CM | POA: Diagnosis not present

## 2021-05-11 DIAGNOSIS — M7989 Other specified soft tissue disorders: Secondary | ICD-10-CM | POA: Diagnosis not present

## 2021-05-11 LAB — TSH: TSH: 12.858 u[IU]/mL — ABNORMAL HIGH (ref 0.350–4.500)

## 2021-05-11 LAB — CBC WITH DIFFERENTIAL/PLATELET
Abs Immature Granulocytes: 0.04 10*3/uL (ref 0.00–0.07)
Basophils Absolute: 0 10*3/uL (ref 0.0–0.1)
Basophils Relative: 1 %
Eosinophils Absolute: 0.1 10*3/uL (ref 0.0–0.5)
Eosinophils Relative: 1 %
HCT: 37.8 % (ref 36.0–46.0)
Hemoglobin: 12.3 g/dL (ref 12.0–15.0)
Immature Granulocytes: 1 %
Lymphocytes Relative: 21 %
Lymphs Abs: 1.6 10*3/uL (ref 0.7–4.0)
MCH: 30.5 pg (ref 26.0–34.0)
MCHC: 32.5 g/dL (ref 30.0–36.0)
MCV: 93.8 fL (ref 80.0–100.0)
Monocytes Absolute: 0.9 10*3/uL (ref 0.1–1.0)
Monocytes Relative: 12 %
Neutro Abs: 4.8 10*3/uL (ref 1.7–7.7)
Neutrophils Relative %: 64 %
Platelets: 182 10*3/uL (ref 150–400)
RBC: 4.03 MIL/uL (ref 3.87–5.11)
RDW: 14.4 % (ref 11.5–15.5)
WBC: 7.4 10*3/uL (ref 4.0–10.5)
nRBC: 0 % (ref 0.0–0.2)

## 2021-05-11 LAB — COMPREHENSIVE METABOLIC PANEL
ALT: 6 U/L (ref 0–44)
AST: 13 U/L — ABNORMAL LOW (ref 15–41)
Albumin: 3.9 g/dL (ref 3.5–5.0)
Alkaline Phosphatase: 84 U/L (ref 38–126)
Anion gap: 6 (ref 5–15)
BUN: 13 mg/dL (ref 8–23)
CO2: 33 mmol/L — ABNORMAL HIGH (ref 22–32)
Calcium: 9.3 mg/dL (ref 8.9–10.3)
Chloride: 97 mmol/L — ABNORMAL LOW (ref 98–111)
Creatinine, Ser: 0.93 mg/dL (ref 0.44–1.00)
GFR, Estimated: 57 mL/min — ABNORMAL LOW (ref >60)
Glucose, Bld: 90 mg/dL (ref 70–99)
Potassium: 4.5 mmol/L (ref 3.5–5.1)
Sodium: 136 mmol/L (ref 135–145)
Total Bilirubin: 0.5 mg/dL (ref 0.3–1.2)
Total Protein: 7.8 g/dL (ref 6.5–8.1)

## 2021-05-11 LAB — ANTITHROMBIN III: AntiThromb III Func: 123 % — ABNORMAL HIGH (ref 75–120)

## 2021-05-11 NOTE — Progress Notes (Signed)
New Consult Note [facility] [address], [place], [place] [address] [phone] (phone) [phone] (fax)  Patient Care Team: [facility] as PCP - General (Geriatric Medicine)   Name of the patient: Oddie Kuhlmann  [id number]  06-27-1924   Date of visit: 05/11/21  Chief complaint/ Reason for visit- Recurrent DVT of RLE  History of Presenting Illness: Gilbert Narain is a 87 year old female with past medical history significant for dementia, COPD, recurrent acute DVT, hypertension, PE, COVID infection in April 2022, who presents for evaluation and management of recurrent DVT.  She initially presented to ER in June 2022 for right leg swelling and pain and was found to have an extensive occlusive thrombus in the superficial femoral vein and common femoral vein and external iliac vein as well as common iliac vein on the right.  She underwent thrombectomy and thrombolysis with Dr. Gilda Crease on 04/03/2021.  She was also found to have a right lower lobe subsegmental PE without heart strain was started on IV heparin then transition to Eliquis.  She was discharged back to SNF.  She read presented to ER on 04/17/2021 with similar right lower extremity swelling and pain and found to have worsening DVT after previous intervention.  IVC filter was placed and she was felt to have a failure of Eliquis.  She was started on a heparin drip and converted to Lovenox.  She is now bridged to Pradaxa.  CTA and CT abdomen pelvis without contrast did not show any obvious findings of malignancy.  She had covid in February and a fall without injury in April. She has limited mobility at baseline and this has worsened due to covid precautions in her facility. No past history of thromboembolism.  Denies history of lupus, nephrotic syndrome, inflammatory bowel disease, pulm myeloproliferative neoplasm.  Does not use HRT.  Denies obstetric history of late term fetal blood loss but does have hx of  miscarriage x 2. Possible hisroy of vte in her mother but unsure. Denies loss of appetite, weight loss, fatigue.  Denies cough, hemoptysis.  No nausea, vomiting, diarrhea, constipation.  Denies hematuria.  No new lumps or bumps.  No fevers or night sweats.  No abdominal pain.  No abnormal bleeding or bruising.  ECOG FS:3 - Symptomatic, >50% confined to bed  Review of systems- Review of Systems  Constitutional:  Negative for chills, fever, malaise/fatigue and weight loss.  HENT:  Negative for hearing loss, nosebleeds, sore throat and tinnitus.   Eyes:  Negative for blurred vision and double vision.  Respiratory:  Negative for cough, hemoptysis, shortness of breath and wheezing.   Cardiovascular:  Negative for chest pain, palpitations and leg swelling.  Gastrointestinal:  Negative for abdominal pain, blood in stool, constipation, diarrhea, melena, nausea and vomiting.  Genitourinary:  Negative for dysuria and urgency.  Musculoskeletal:  Negative for back pain, falls, joint pain and myalgias.  Skin:  Negative for itching and rash.  Neurological:  Negative for dizziness, tingling, sensory change, loss of consciousness, weakness and headaches.  Endo/Heme/Allergies:  Negative for environmental allergies. Does not bruise/bleed easily.  Psychiatric/Behavioral:  Negative for depression. The patient is not nervous/anxious and does not have insomnia.      Allergies  Allergen Reactions   Ciprofloxacin Hives    Past Medical History:  Diagnosis Date   Bronchitis, not specified as acute or chronic    Essential (primary) hypertension    Other emphysema (HCC)    Tinea unguium    Urge incontinence  Past Surgical History:  Procedure Laterality Date   ABDOMINAL HYSTERECTOMY     CHOLECYSTECTOMY     IVC FILTER INSERTION N/A 04/18/2021   Procedure: IVC FILTER INSERTION;  Surgeon: Annice Needy, MD;  Location: [facility] INVASIVE CV [facility];  Service: Cardiovascular;  Laterality: N/A;   PERIPHERAL VASCULAR  THROMBECTOMY Right 04/03/2021   Procedure: PERIPHERAL VASCULAR THROMBECTOMY;  Surgeon: Renford Dills, MD;  Location: [facility] INVASIVE CV LAB;  Service: Cardiovascular;  Laterality: Right;    Social History   Socioeconomic History   Marital status: Widowed    Spouse name: Not on file   Number of children: Not on file   Years of education: Not on file   Highest education level: Not on file  Occupational History   Not on file  Tobacco Use   Smoking status: Former   Smokeless tobacco: Never  Vaping Use   Vaping Use: Never used  Substance and Sexual Activity   Alcohol use: Not Currently    Alcohol/week: 1.0 - 2.0 standard drink    Types: 1 - 2 Glasses of wine per week    Comment: 1-2 glasses of wine per day   Drug use: No   Sexual activity: Not on file  Other Topics Concern   Not on file  Social History Narrative   Not on file   Social Determinants of Health   Financial Resource Strain: Not on file  Food Insecurity: Not on file  Transportation Needs: Not on file  Physical Activity: Not on file  Stress: Not on file  Social Connections: Not on file  Intimate Partner Violence: Not on file  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   Family History  Problem Relation Age of Onset   Hypertension Mother    Heart attack Father     Current Outpatient Medications:    acetaminophen (TYLENOL) 325 MG tablet, Take 325 mg by mouth every 4 (four) hours as needed for mild pain., Disp: , Rfl:    albuterol (VENTOLIN HFA) 108 (90 Base) MCG/ACT inhaler, Inhale 1 puff into the lungs 2 (two) times daily., Disp: , Rfl:    amLODipine (NORVASC) 5 MG tablet, Take 5 mg by mouth daily. , Disp: , Rfl:    ascorbic acid (VITAMIN C) 500 MG tablet, Take 1,000 mg by mouth daily., Disp: , Rfl:    benzocaine (ORAJEL) 10 % mucosal gel, Use as directed 1 application in the mouth or throat every 4 (four) hours as needed for mouth pain., Disp: , Rfl:    Calcium Carb-Cholecalciferol (OYSTER SHELL  CALCIUM) 500-400 MG-UNIT TABS, Take 1 tablet by mouth daily., Disp: , Rfl:    dabigatran (PRADAXA) 150 MG CAPS capsule, One tablet twice a day starting Sunday 04/22/2021 in the morning, Disp: 60 capsule, Rfl: 0   enoxaparin (LOVENOX) 60 MG/0.6ML injection, Administer 50mg  subcutaneous injection twice a day for three doses (Patient not taking: Reported on 05/03/2021), Disp: 3 mL, Rfl: 0   furosemide (LASIX) 40 MG tablet, Take 40 mg by mouth daily. (Patient not taking: Reported on 05/03/2021), Disp: , Rfl:    metoprolol succinate (TOPROL-XL) 25 MG 24 hr tablet, Take 25 mg by mouth at bedtime. , Disp: , Rfl:    potassium chloride SA (KLOR-CON) 20 MEQ tablet, Take 20 mEq by mouth daily., Disp: , Rfl:   Physical exam: There were no vitals filed for this visit. Physical Exam Constitutional:      Comments: Elderly. Accompanied by daughter.   Eyes:  Conjunctiva/sclera: Conjunctivae normal.  Cardiovascular:     Rate and Rhythm: Normal rate and regular rhythm.  Pulmonary:     Effort: No respiratory distress.     Breath sounds: No wheezing.  Abdominal:     General: There is no distension.     Tenderness: There is no abdominal tenderness. There is no guarding.  Musculoskeletal:     Right lower leg: Edema (2+, diffusely pink) present.     Left lower leg: No edema.     Comments: 4 wheel rolling walker  Skin:    General: Skin is warm and dry.     Coloration: Skin is not pale.     Findings: No rash.  Neurological:     Mental Status: Mental status is at baseline.  Psychiatric:        Behavior: Behavior is slowed.        Thought Content: Thought content normal.        Cognition and Memory: Cognition is impaired. Memory is impaired.     CMP Latest Ref Rng & Units 04/19/2021  Glucose 70 - 99 mg/dL 88  BUN 8 - 23 mg/dL 9  Creatinine 5.00 - 9.38 mg/dL 1.82  Sodium 993 - 716 mmol/L 137  Potassium 3.5 - 5.1 mmol/L 3.8  Chloride 98 - 111 mmol/L 104  CO2 22 - 32 mmol/L 28  Calcium 8.9 - 10.3 mg/dL  9.6(V)  Total Protein 6.5 - 8.1 g/dL -  Total Bilirubin 0.3 - 1.2 mg/dL -  Alkaline Phos 38 - 893 U/L -  AST 15 - 41 U/L -  ALT 0 - 44 U/L -   CBC Latest Ref Rng & Units 04/19/2021  WBC 4.0 - 10.5 K/uL 6.4  Hemoglobin 12.0 - 15.0 g/dL 11.0(L)  Hematocrit 36.0 - 46.0 % 33.6(L)  Platelets 150 - 400 K/uL 169    No images are attached to the encounter.  CT ABDOMEN PELVIS WO CONTRAST  Result Date: 04/19/2021 CLINICAL DATA:  Weight loss. Recurrent deep vein thrombosis of the RIGHT LOWER extremity. EXAM: CT ABDOMEN AND PELVIS WITHOUT CONTRAST TECHNIQUE: Multidetector CT imaging of the abdomen and pelvis was performed following the standard protocol without IV contrast. COMPARISON:  CT angio chest on 04/02/2021 FINDINGS: Lower chest: Large hiatal hernia present. Coronary artery calcifications are present. Hepatobiliary: Ventriculoperitoneal shunt is coiled in the RIGHT UPPER QUADRANT adjacent to the liver. Course of the catheter is unremarkable. The liver is homogeneous. Status post cholecystectomy. Pancreas: Atrophic, atrophic pancreas without mass. Spleen: Normal in size without focal abnormality. Adrenals/Urinary Tract: Normal appearance of the adrenal glands. Benign LEFT renal cyst is 2.0 centimeters. There is extra renal pelvis bilaterally, RIGHT greater than LEFT. No intrarenal calculi or ureteral stones. The bladder and visualized portion of the urethra are normal. Stomach/Bowel: Large hiatal hernia. Stomach is otherwise normal in appearance. The small bowel loops are unremarkable. There is a large amount of stool throughout loops of colon. Scattered colonic diverticula are present. No evidence for acute diverticulitis. Surgical clips are present in the RIGHT LOWER QUADRANT. Vascular/Lymphatic: Inferior vena cava filter is in place, apex at L2. There is dense atherosclerotic calcification of the abdominal aorta. Saccular aneurysm of the infrarenal aorta is 3.4 centimeters. Reproductive: Hysterectomy.  Other: Anterior abdominal wall is unremarkable. There is stranding/edema in the RIGHT inguinal region consistent with recent venous access for IVC filter. Musculoskeletal: Remote RIGHT inferior pubic ramus fracture. There are chronic superior endplate fractures of T11 and T12. IMPRESSION: 1. No acute abnormality of the abdomen  or pelvis. 2. Large hiatal hernia. 3. Unremarkable appearance of ventriculoperitoneal shunt in the RIGHT UPPER QUADRANT. 4. Prior cholecystectomy, appendectomy, and hysterectomy. 5. Colonic diverticulosis without acute diverticulitis. 6.  Aortic atherosclerosis.  (ICD10-I70.0) 7. Aortic aneurysm NOS (ICD10-I71.9). Saccular infrarenal abdominal aortic aneurysm measuring 3.4 centimeters. Recommend referral to a vascular specialist. This recommendation follows ACR consensus guidelines: White Paper of the ACR Incidental Findings Committee II on Vascular Findings. J Am Coll Radiol 2013; 10:789-794. 8. Recent placement of inferior vena cava filter. Electronically Signed   By: Norva Pavlov M.D.   On: 04/19/2021 15:59   PERIPHERAL VASCULAR CATHETERIZATION  Result Date: 04/18/2021 See surgical note for result.  US Venous Img Lower Unilateral Right  Result Date: 04/18/2021 CLINICAL DATA:  Right lower extremity swelling EXAM: RIGHT LOWER EXTREMITY VENOUS DOPPLER ULTRASOUND TECHNIQUE: Gray-scale sonography with graded compression, as well as color Doppler and duplex ultrasound were performed to evaluate the lower extremity deep venous systems from the level of the common femoral vein and including the common femoral, femoral, profunda femoral, popliteal and calf veins including the posterior tibial, peroneal and gastrocnemius veins when visible. The superficial great saphenous vein was also interrogated. Spectral Doppler was utilized to evaluate flow at rest and with distal augmentation maneuvers in the common femoral, femoral and popliteal veins. COMPARISON:  04/02/2021 FINDINGS:  Contralateral Common Femoral Vein: Respiratory phasicity is normal and symmetric with the symptomatic side. No evidence of thrombus. Normal compressibility. Common Femoral Vein: Thrombus is noted with decreased compressibility. Saphenofemoral Junction: Thrombus is noted with decreased compressibility. Profunda Femoral Vein: Thrombus is noted with decreased compressibility. Femoral Vein: Thrombus is noted with decreased compressibility. Popliteal Vein: Thrombus is noted with decreased compressibility. Calf Veins: Thrombus is noted with decreased compressibility. Superficial Great Saphenous Vein: Thrombus is noted with decreased compressibility from the mid thigh to the saphenofemoral junction. Venous Reflux:  None. Other Findings:  None. IMPRESSION: Diffuse deep venous thrombosis in the right leg similar to that seen on prior exam. Electronically Signed   By: Alcide Clever M.D.   On: 04/18/2021 00:00   VAS [place] LOWER EXTREMITY VENOUS (DVT)  Result Date: 05/07/2021  Lower Venous DVT Study Patient Name:  ETHLEEN LORMAND  Date of Exam:   05/03/2021 Medical Rec #: [id number]        Accession #:    [id number] Date of Birth: 11-Jan-1924        Patient Gender: F Patient Age:   87Y Exam Location:  [facility] Procedure:      VAS [place] LOWER EXTREMITY VENOUS (DVT) Referring Phys: [id number] JASON S DEW --------------------------------------------------------------------------------  Indications: Swelling, and right.  Risk Factors: DVT right. Comparison Study: 04/17/2021 Performing Technologist: Salvadore Farber RVT  Examination Guidelines: A complete evaluation includes B-mode imaging, spectral Doppler, color Doppler, and power Doppler as needed of all accessible portions of each vessel. Bilateral testing is considered an integral part of a complete examination. Limited examinations for reoccurring indications may be performed as noted. The reflux portion of the exam is performed with the patient in reverse Trendelenburg.   +---------+---------------+---------+-----------+-----------------+------------+ RIGHT    CompressibilityPhasicitySpontaneityProperties       Thrombus  Aging        +---------+---------------+---------+-----------+-----------------+------------+ CFV      None                                                             +---------+---------------+---------+-----------+-----------------+------------+ SFJ      Partial                                                          +---------+---------------+---------+-----------+-----------------+------------+ FV Prox  None                                                             +---------+---------------+---------+-----------+-----------------+------------+ FV Mid   None                                                             +---------+---------------+---------+-----------+-----------------+------------+ FV DistalNone                                                             +---------+---------------+---------+-----------+-----------------+------------+ PFV      Partial                            partially                                                                 re-cannalized                 +---------+---------------+---------+-----------+-----------------+------------+ POP      Partial                            partially                                                                 re-cannalized                 +---------+---------------+---------+-----------+-----------------+------------+ PTV      Partial  partially                                                                 re-cannalized                 +---------+---------------+---------+-----------+-----------------+------------+ PERO     Partial                            partially                                                                  re-cannalized                 +---------+---------------+---------+-----------+-----------------+------------+ Gastroc  Full           Yes      Yes                                      +---------+---------------+---------+-----------+-----------------+------------+ GSV      Partial                            partially                                                                 re-cannalized                 +---------+---------------+---------+-----------+-----------------+------------+ SSV      Full           Yes      Yes                                      +---------+---------------+---------+-----------+-----------------+------------+   +----+---------------+---------+-----------+----------+--------------+ LEFTCompressibilityPhasicitySpontaneityPropertiesThrombus Aging +----+---------------+---------+-----------+----------+--------------+ CFV Full           Yes      Yes                                 +----+---------------+---------+-----------+----------+--------------+ SFJ Full           Yes      Yes                                 +----+---------------+---------+-----------+----------+--------------+     Summary: RIGHT: - Findings consistent with acute deep vein thrombosis involving the right common femoral vein, right femoral vein, and right popliteal vein. - Findings consistent with chronic deep vein thrombosis involving the right peroneal veins, and right posterior tibial veins. - Findings consistent with chronic superficial vein thrombosis  involving the right great saphenous vein. - Findings suggest resolution of previously noted thrombus. Findings appear essentially unchanged compared to previous examination.  LEFT: - No evidence of common femoral vein obstruction.  *See table(s) above for measurements and observations. Electronically signed by Levora Dredge MD on 05/07/2021 at 1:10:28 PM.    Final      Assessment and plan- Patient is a 87 y.o. female who presents for evaluation and management of recurrent dvt and chronic anticoagulation.   Imaging did not reveal obvious underlying malignancy. Family history not consistent with inherited hypercoaguable state. Patient denies personal history of vte prior to this event. Recent covid infection possibly contributing though somewhat delayed. Suspect immobility likely primary etiology. However, will check hypercoaguable labs today to evaluate for underlying. Hold off on additional imaging at this time given recent negative imaging in June. Continue pradaxa with recommendation for lifelong anticoagulation. Lengthy discussion today regarding risk vs benefit and alternatives to pradaxa given patient's increased frailty, hx of falls, cognitive impairment, and increased bleeding risk. Discussed warfarin as an alternative however, frequent lab monitoring. Symptoms of bleeding reviewed.   Plan:  Continue pradaxa Labs today Rtc in 1-2 weeks to establish care with MD, discuss lab results, and management plan.    Visit Diagnosis 1. Recurrent deep vein thrombosis (DVT) of right lower extremity (HCC)   2. Presence of IVC filter   3. Chronic anticoagulation     Patient expressed understanding and was in agreement with this plan. She also understands that She can call clinic at any time with any questions, concerns, or complaints.   Thank you for allowing me to participate in the care of this very pleasant patient.   Consuello Masse, DNP, AGNP-[facility] [phone]  Addendum: patient's TSH elevated at 12. Will reach out to PCP to consider management.

## 2021-05-12 LAB — CARDIOLIPIN ANTIBODIES, IGG, IGM, IGA
Anticardiolipin IgA: <9 APL U/mL (ref 0–11)
Anticardiolipin IgG: <9 GPL U/mL (ref 0–14)
Anticardiolipin IgM: 84 MPL U/mL — ABNORMAL HIGH (ref 0–12)

## 2021-05-12 LAB — PROTEIN C, TOTAL: Protein C, Total: 114 % (ref 60–150)

## 2021-05-13 LAB — HOMOCYSTEINE: Homocysteine: 27.3 umol/L — ABNORMAL HIGH (ref 0.0–21.3)

## 2021-05-14 LAB — BETA-2-GLYCOPROTEIN I ABS, IGG/M/A
Beta-2 Glyco I IgG: <9 GPI IgG units (ref 0–20)
Beta-2-Glycoprotein I IgA: <9 GPI IgA units (ref 0–25)
Beta-2-Glycoprotein I IgM: 143 GPI IgM units — ABNORMAL HIGH (ref 0–32)

## 2021-05-16 LAB — PROTEIN S ACTIVITY: Protein S Activity: >280 % — ABNORMAL HIGH (ref 63–140)

## 2021-05-16 LAB — FACTOR 5 LEIDEN

## 2021-05-16 LAB — PROTEIN C ACTIVITY: Protein C Activity: 123 % (ref 73–180)

## 2021-05-16 LAB — PROTEIN S, TOTAL: Protein S Ag, Total: 136 % (ref 60–150)

## 2021-05-17 LAB — LUPUS ANTICOAGULANT PANEL
DRVVT: >180 s — ABNORMAL HIGH (ref 0.0–47.0)
PTT Lupus Anticoagulant: 154.1 s — ABNORMAL HIGH (ref 0.0–51.9)

## 2021-05-17 LAB — HEXAGONAL PHASE PHOSPHOLIPID: Hexagonal Phase Phospholipid: 23 s — ABNORMAL HIGH (ref 0–11)

## 2021-05-17 LAB — DRVVT MIX: dRVVT Mix: 124.2 s — ABNORMAL HIGH (ref 0.0–40.4)

## 2021-05-17 LAB — PROTHROMBIN GENE MUTATION

## 2021-05-17 LAB — DRVVT CONFIRM: dRVVT Confirm: >1.3 ratio — ABNORMAL HIGH (ref 0.8–1.2)

## 2021-05-17 LAB — PTT-LA MIX: PTT-LA Mix: 119.7 s — ABNORMAL HIGH (ref 0.0–48.9)

## 2021-05-22 ENCOUNTER — Ambulatory Visit: Payer: Medicare Other | Admitting: Internal Medicine

## 2021-05-23 ENCOUNTER — Other Ambulatory Visit: Payer: Self-pay

## 2021-05-23 ENCOUNTER — Encounter: Payer: Self-pay | Admitting: Oncology

## 2021-05-23 ENCOUNTER — Other Ambulatory Visit: Payer: Self-pay | Admitting: *Deleted

## 2021-05-23 ENCOUNTER — Telehealth (INDEPENDENT_AMBULATORY_CARE_PROVIDER_SITE_OTHER): Payer: Self-pay

## 2021-05-23 ENCOUNTER — Inpatient Hospital Stay: Payer: Medicare Other | Admitting: Oncology

## 2021-05-23 VITALS — BP 148/83 | HR 57 | Temp 97.6°F | Resp 14 | Wt 111.0 lb

## 2021-05-23 DIAGNOSIS — I82401 Acute embolism and thrombosis of unspecified deep veins of right lower extremity: Secondary | ICD-10-CM

## 2021-05-23 DIAGNOSIS — Z7901 Long term (current) use of anticoagulants: Secondary | ICD-10-CM

## 2021-05-23 NOTE — Telephone Encounter (Signed)
Please schedule per the NP. °

## 2021-05-23 NOTE — Progress Notes (Signed)
Was switched from Eliquis to Pradaxaabouttwo weeks ago; pt states that it makes her very sleepy and she does not like it. Daughter will like to know if she can start Eliquis once again. Also, pts daughter states that her leg seems to be getting bigger in size, per pt t is not painful but has noticed the increase in size.

## 2021-05-23 NOTE — Telephone Encounter (Signed)
DVT study to see GS, due to some lingering questions about anticoagulation

## 2021-05-23 NOTE — Telephone Encounter (Signed)
Called patients daughter and they were seen by the hematologist today Dr. Danella Penton.  Patients daughter didn't think patients leg was emergent as the facility made it out to be.  Patient was able to walk perfectly fine.  She has decided to keep an eye on it and not schedule an appointment, but will keep in touch if anything changes.

## 2021-05-23 NOTE — Progress Notes (Signed)
Hematology/Oncology Consult note [facility]  Telephone:([phone] [phone]  Patient Care Team: [facility], [facility] as PCP - General (Geriatric Medicine)   Name of the patient: Anne Harrison  [id number]  [birth year]   Date of visit: 05/23/21  Diagnosis-history of right lower extremity DVT in June 2022  Chief complaint/ Reason for visit-discuss results of blood work  Heme/Onc history: Patient is a 87 year old female with a past medical history significant for hypertension COPD dementia who had COVID infection sometime in April 2022.  She then presented to the ER in June 2022 for symptoms of right lower extremity pain and swelling and was noted to have extensive occlusive thrombus involving the superficial femoral vein common femoral vein external iliac vein as well as common iliac vein.  She underwent thrombectomy and thrombolysis with Dr. Gilda Crease on 04/03/2021.  At that time she was also found to have a right lower lobe subsegmental PE without right heart strain.  She was initially on heparin and was then transition to Eliquis and discharged to rehab.  2 weeks later she came in with worsening right lower extremity swelling which showed diffuse DVT in the right leg similar to that seen in the previous exam.  CT abdomen and pelvis without contrast did not reveal any acute pathology other than a large hiatal hernia and VP shunt in the right upper quadrant.  This was deemed to be a failure of Eliquis for unclear reasons and patient was switched to Pradaxa while she was inpatient.  Patient was seen by NP Consuello Masse in July 2022 and underwent hypercoagulable work-up which did not show any evidence of protein C, protein S, Antithrombin III deficiency.  No evidence of factor V Leiden or prothrombin gene mutation.  Anticardiolipin antibody IgM titer was elevated at 84 and beta-2 glycoprotein IgM elevated at 143.Lupus anticoagulant was also detected although  that would be uninterpretable in the presence of Pradaxa.   Interval history-patient is here with her daughter today.  She has been having some waxing and waning right lower extremity swelling since her DVT diagnosis.  Patient's daughter states that maybe her swelling is a little worse today as compared to last week but overall the swelling does appear less her as compared to her initial diagnosis in June 2022.  ECOG PS- 2 Pain scale- 2  Review of systems- Review of Systems  Constitutional:  Positive for malaise/fatigue. Negative for chills, fever and weight loss.  HENT:  Negative for congestion, ear discharge and nosebleeds.   Eyes:  Negative for blurred vision.  Respiratory:  Negative for cough, hemoptysis, sputum production, shortness of breath and wheezing.   Cardiovascular:  Positive for leg swelling. Negative for chest pain, palpitations, orthopnea and claudication.  Gastrointestinal:  Negative for abdominal pain, blood in stool, constipation, diarrhea, heartburn, melena, nausea and vomiting.  Genitourinary:  Negative for dysuria, flank pain, frequency, hematuria and urgency.  Musculoskeletal:  Negative for back pain, joint pain and myalgias.  Skin:  Negative for rash.  Neurological:  Negative for dizziness, tingling, focal weakness, seizures, weakness and headaches.  Endo/Heme/Allergies:  Does not bruise/bleed easily.  Psychiatric/Behavioral:  Negative for depression and suicidal ideas. The patient does not have insomnia.       Allergies  Allergen Reactions   Ciprofloxacin Hives     Past Medical History:  Diagnosis Date   Bronchitis, not specified as acute or chronic    Essential (primary) hypertension    Other emphysema (HCC)    Tinea  unguium    Urge incontinence      Past Surgical History:  Procedure Laterality Date   ABDOMINAL HYSTERECTOMY     CHOLECYSTECTOMY     IVC FILTER INSERTION N/A 04/18/2021   Procedure: IVC FILTER INSERTION;  Surgeon: Annice Needy, MD;   Location: [facility] INVASIVE CV LAB;  Service: Cardiovascular;  Laterality: N/A;   PERIPHERAL VASCULAR THROMBECTOMY Right 04/03/2021   Procedure: PERIPHERAL VASCULAR THROMBECTOMY;  Surgeon: Renford Dills, MD;  Location: [facility] INVASIVE CV LAB;  Service: Cardiovascular;  Laterality: Right;    Social History   Socioeconomic History   Marital status: Widowed    Spouse name: Not on file   Number of children: Not on file   Years of education: Not on file   Highest education level: Not on file  Occupational History   Not on file  Tobacco Use   Smoking status: Former   Smokeless tobacco: Never  Vaping Use   Vaping Use: Never used  Substance and Sexual Activity   Alcohol use: Not Currently    Alcohol/week: 1.0 - 2.0 standard drink    Types: 1 - 2 Glasses of wine per week    Comment: 1-2 glasses of wine per day   Drug use: No   Sexual activity: Not on file  Other Topics Concern   Not on file  Social History Narrative   Not on file   Social Determinants of Health   Financial Resource Strain: Not on file  Food Insecurity: Not on file  Transportation Needs: Not on file  Physical Activity: Not on file  Stress: Not on file  Social Connections: Not on file  Intimate Partner Violence: Not on file    Family History  Problem Relation Age of Onset   Hypertension Mother    Heart attack Father      Current Outpatient Medications:    acetaminophen (TYLENOL) 325 MG tablet, Take 325 mg by mouth every 4 (four) hours as needed for mild pain., Disp: , Rfl:    albuterol (VENTOLIN HFA) 108 (90 Base) MCG/ACT inhaler, Inhale 1 puff into the lungs 2 (two) times daily., Disp: , Rfl:    amLODipine (NORVASC) 5 MG tablet, Take 5 mg by mouth daily. , Disp: , Rfl:    ascorbic acid (VITAMIN C) 500 MG tablet, Take 1,000 mg by mouth daily., Disp: , Rfl:    benzocaine (ORAJEL) 10 % mucosal gel, Use as directed 1 application in the mouth or throat every 4 (four) hours as needed for mouth pain., Disp: , Rfl:     Calcium Carb-Cholecalciferol (OYSTER SHELL CALCIUM) 500-400 MG-UNIT TABS, Take 1 tablet by mouth daily., Disp: , Rfl:    dabigatran (PRADAXA) 150 MG CAPS capsule, One tablet twice a day starting Sunday 04/22/2021 in the morning, Disp: 60 capsule, Rfl: 0   metoprolol succinate (TOPROL-XL) 25 MG 24 hr tablet, Take 25 mg by mouth at bedtime. , Disp: , Rfl:    potassium chloride SA (KLOR-CON) 20 MEQ tablet, Take 20 mEq by mouth daily., Disp: , Rfl:   Physical exam:  Vitals:   05/23/21 1411  BP: (!) 148/83  Pulse: (!) 57  Resp: 14  Temp: 97.6 F (36.4 C)  SpO2: 96%  Weight: 111 lb (50.4 kg)   Physical Exam Constitutional:      General: She is not in acute distress. Cardiovascular:     Rate and Rhythm: Normal rate and regular rhythm.     Heart sounds: Normal heart sounds.  Pulmonary:  Effort: Pulmonary effort is normal.     Breath sounds: Normal breath sounds.  Abdominal:     General: Bowel sounds are normal.     Palpations: Abdomen is soft.  Musculoskeletal:     Comments: Right lower extremity appears swollen and erythematous as compared to the left  Skin:    General: Skin is warm and dry.  Neurological:     Mental Status: She is alert and oriented to person, place, and time.     CMP Latest Ref Rng & Units 05/11/2021  Glucose 70 - 99 mg/dL 90  BUN 8 - 23 mg/dL 13  Creatinine 4.09 - 8.11 mg/dL 9.14  Sodium 782 - 956 mmol/L 136  Potassium 3.5 - 5.1 mmol/L 4.5  Chloride 98 - 111 mmol/L 97(L)  CO2 22 - 32 mmol/L 33(H)  Calcium 8.9 - 10.3 mg/dL 9.3  Total Protein 6.5 - 8.1 g/dL 7.8  Total Bilirubin 0.3 - 1.2 mg/dL 0.5  Alkaline Phos 38 - 126 U/L 84  AST 15 - 41 U/L 13(L)  ALT 0 - 44 U/L 6   CBC Latest Ref Rng & Units 05/11/2021  WBC 4.0 - 10.5 K/uL 7.4  Hemoglobin 12.0 - 15.0 g/dL 21.3  Hematocrit 08.6 - 46.0 % 37.8  Platelets 150 - 400 K/uL 182      VAS [place] LOWER EXTREMITY VENOUS (DVT)  Result Date: 05/07/2021  Lower Venous DVT Study Patient Name:  TAMMERA ENGERT  Date of Exam:   05/03/2021 Medical Rec #: [id number]        Accession #:    [id number] Date of Birth: 1924/06/16        Patient Gender: F Patient Age:   13Y Exam Location:  [facility] Procedure:      VAS [place] LOWER EXTREMITY VENOUS (DVT) Referring Phys: [id number] JASON S DEW --------------------------------------------------------------------------------  Indications: Swelling, and right.  Risk Factors: DVT right. Comparison Study: 04/17/2021 Performing Technologist: Salvadore Farber RVT  Examination Guidelines: A complete evaluation includes B-mode imaging, spectral Doppler, color Doppler, and power Doppler as needed of all accessible portions of each vessel. Bilateral testing is considered an integral part of a complete examination. Limited examinations for reoccurring indications may be performed as noted. The reflux portion of the exam is performed with the patient in reverse Trendelenburg.  +---------+---------------+---------+-----------+-----------------+------------+ RIGHT    CompressibilityPhasicitySpontaneityProperties       Thrombus                                                                  Aging        +---------+---------------+---------+-----------+-----------------+------------+ CFV      None                                                             +---------+---------------+---------+-----------+-----------------+------------+ SFJ      Partial                                                          +---------+---------------+---------+-----------+-----------------+------------+  FV Prox  None                                                             +---------+---------------+---------+-----------+-----------------+------------+ FV Mid   None                                                             +---------+---------------+---------+-----------+-----------------+------------+ FV DistalNone                                                              +---------+---------------+---------+-----------+-----------------+------------+ PFV      Partial                            partially                                                                 re-cannalized                 +---------+---------------+---------+-----------+-----------------+------------+ POP      Partial                            partially                                                                 re-cannalized                 +---------+---------------+---------+-----------+-----------------+------------+ PTV      Partial                            partially                                                                 re-cannalized                 +---------+---------------+---------+-----------+-----------------+------------+ PERO     Partial                            partially  re-cannalized                 +---------+---------------+---------+-----------+-----------------+------------+ Gastroc  Full           Yes      Yes                                      +---------+---------------+---------+-----------+-----------------+------------+ GSV      Partial                            partially                                                                 re-cannalized                 +---------+---------------+---------+-----------+-----------------+------------+ SSV      Full           Yes      Yes                                      +---------+---------------+---------+-----------+-----------------+------------+   +----+---------------+---------+-----------+----------+--------------+ LEFTCompressibilityPhasicitySpontaneityPropertiesThrombus Aging +----+---------------+---------+-----------+----------+--------------+ CFV Full           Yes      Yes                                  +----+---------------+---------+-----------+----------+--------------+ SFJ Full           Yes      Yes                                 +----+---------------+---------+-----------+----------+--------------+     Summary: RIGHT: - Findings consistent with acute deep vein thrombosis involving the right common femoral vein, right femoral vein, and right popliteal vein. - Findings consistent with chronic deep vein thrombosis involving the right peroneal veins, and right posterior tibial veins. - Findings consistent with chronic superficial vein thrombosis involving the right great saphenous vein. - Findings suggest resolution of previously noted thrombus. Findings appear essentially unchanged compared to previous examination.  LEFT: - No evidence of common femoral vein obstruction.  *See table(s) above for measurements and observations. Electronically signed by Levora Dredge MD on 05/07/2021 at 1:10:28 PM.    Final      Assessment and plan- Patient is a 87 y.o. female with history of extensive right lower extremity DVT in June 2022 currently on Pradaxa here for routine follow-up and discuss the results of blood work  Clinically patient does have swollen and erythematous right lower extremity which patient's daughter says has been waxing and waning since her DVT diagnosis in June 2022.  Leg to her looks somewhat worse as compared to a few days ago but overall swelling is better as compared to her initial diagnosis.  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  I have asked the patient's daughter to keep an eye on her lower extremity over the  next week or so and if there is consistent worsening in her swelling she can get in touch with vascular surgery or me and we will consider doing a repeat ultrasound at that time.  Clinically this does not appear to be infected today and I am not giving her any antibiotics.  With regards to her hypercoagulable  work-up patient did not have any evidence of protein C, protein S, Antithrombin III factor V Leiden or prothrombin gene deficiency.  She was found to have a positive lupus anticoagulant which can be falsely abnormal in the presence of Pradaxa.  However patient was also found to have elevated IgM anticardiolipin antibody as well as beta-2 glycoprotein levels of greater than 40.  Elevated antiphospholipid levels have been noted in patients post COVID but typically triple positive result is rare.  Having said that patient has gone through 89 years of her life without any prior history of DVT.  It is unclear if these levels are elevated in the setting of post COVID or truly abnormal values in the setting of antiphospholipid antibody syndrome.  I will be repeating these levels in the third week of October roughly 12 weeks from her initial tests.  With regards to anticoagulation I would like the patient to remain at least on 6 months of anticoagulation especially given her post thrombotic syndrome.  Risks of continued anticoagulation will need to be weighed against risk of bleeding especially given her age and risk of falls.  Patient's daughter is concerned about the co-pay associated with Pradaxa.  It is not entirely clear as to why patient was switched to Pradaxa from Eliquis when she did not have a true failure of Eliquis.  And patient came back for worsening lower extremity swelling the clot burden was similar to prior.  I suspect patient was just having post thrombotic syndrome in the setting of acute DVT and not a true Eliquis failure.  I will reach out to Dr. Gilda Crease as well and see if it would be reasonable to switch her back to Eliquis if there is a financial aspect of continuing Pradaxa.  For now patient will continue on Pradaxa  Also explained to the patient's daughter that if her antiphospholipid antibody levels remain abnormal at the end of 12 weeks then ideally an APS Coumadin is supposed to be better as  compared to newer anticoagulants and preventing a recurrent DVT.  However Coumadin is associated with a slightly increased risk of bleeding as compared to NOACs.  There is also need to monitor frequent INRs, regulate her diet and be mindful of interactions with antibiotics.  We will need to factor all these things once her results are back in October.  Patient's daughter verbalized understanding   Total face to face encounter time for this patient visit was 30 min.  >50% of the time was spent in face to face counseling and coordination of care   Visit Diagnosis 1. Acute deep vein thrombosis (DVT) of right lower extremity, unspecified vein (HCC)      Dr. Owens Shark, MD, MPH [facility] [phone] 05/23/2021 3:49 PM

## 2021-05-23 NOTE — Telephone Encounter (Signed)
The pt's daughter called the nurses line and left a VM  saying she would like to know if her mom could be seen in the office she is having swelling in her Rt LE. The pt has a Hx of DVT in the Rt leg  and the leg is swollen from the thigh down causing the pt some discomfort . The Pt per the daughter denied any redness or warmth to the touch and the the leg is not shiny, just tender to the touch. The pt should come in for DVT US and office Visit . Please advise.

## 2021-05-28 ENCOUNTER — Other Ambulatory Visit (INDEPENDENT_AMBULATORY_CARE_PROVIDER_SITE_OTHER): Payer: Self-pay | Admitting: Vascular Surgery

## 2021-05-28 DIAGNOSIS — I82401 Acute embolism and thrombosis of unspecified deep veins of right lower extremity: Secondary | ICD-10-CM

## 2021-05-28 DIAGNOSIS — I2782 Chronic pulmonary embolism: Secondary | ICD-10-CM

## 2021-05-28 MED ORDER — APIXABAN 5 MG PO TABS: 5.0000 mg | ORAL_TABLET | Freq: Two times a day (BID) | ORAL | 10 refills | Status: AC

## 2021-05-28 NOTE — Progress Notes (Signed)
I have communicated with Dr. Smith Robert hematology and we both are of similar opinion and feel that given the patient's age and the Versed to Litty of Eliquis that switching from Pradaxa to Eliquis would be of benefit.  I will place the order.  There is no need to do the 10 twice daily for 1 week because she has already been on anticoagulation and will just begin 5.0 mg of Eliquis p.o. twice daily once the prescription has been filled

## 2021-05-31 ENCOUNTER — Telehealth (INDEPENDENT_AMBULATORY_CARE_PROVIDER_SITE_OTHER): Payer: Self-pay

## 2021-05-31 NOTE — Telephone Encounter (Signed)
Anne Harrison from [name] left a voicemail stating the patient was giving a new prescription to start Eliquis 5 mg twice a day. The patient was currently taking Pradaxa 150mg  twice a day. was seeing switch medication the patient she continue. I spoke with Dr Harriett Sine he stated the Pradaxa should be discontinue and she be taking Eliquis. Gilda Crease was giving verbal orders with medication changes.

## 2021-07-30 IMAGING — CT CT ANGIO CHEST
2 of 6 series · 18 of 46 positions shown · IV contrast (APPLIED)
Comparison: None

CLINICAL DATA: RIGHT leg redness and swelling, dementia, high
clinical suspicion of pulmonary embolism

EXAM:
CT ANGIOGRAPHY CHEST WITH CONTRAST
TECHNIQUE: Multidetector CT imaging of the chest was performed using the
standard protocol during bolus administration of intravenous
contrast. Multiplanar CT image reconstructions and MIPs were
obtained to evaluate the vascular anatomy.
CONTRAST:  75mL OMNIPAQUE IOHEXOL 350 MG/ML SOLN IV

[Series 5: thins · axial · 0.62mm/px · z∈[-310,-48]mm · 15 of 357 slices shown]
[im 15/357  lung]
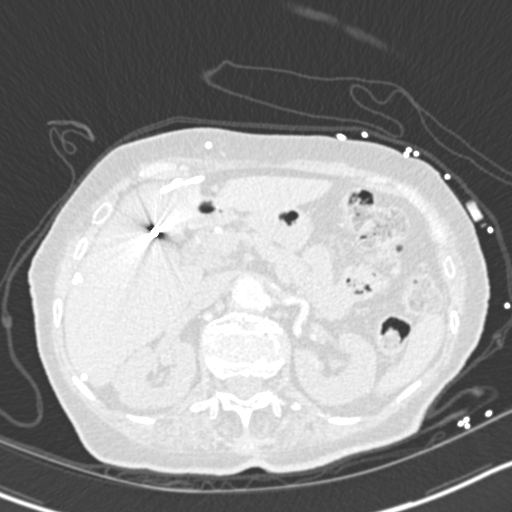
[im 45/357  soft-tissue]
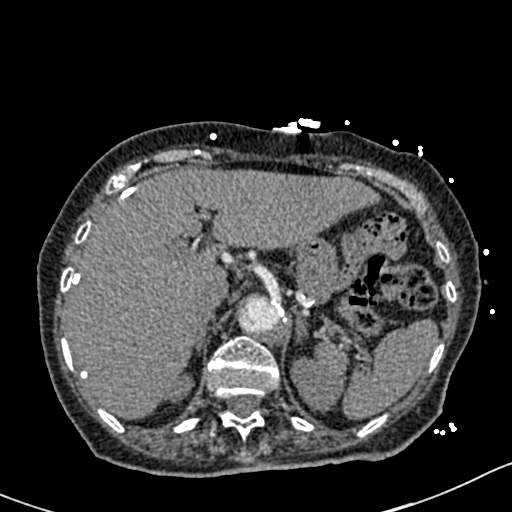
[im 60/357  lung]
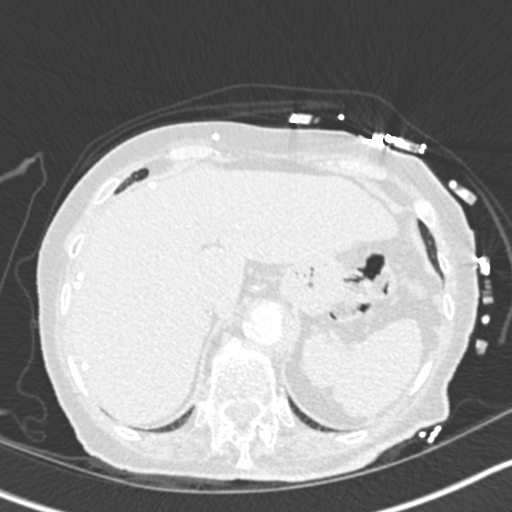
[im 90/357  soft-tissue]
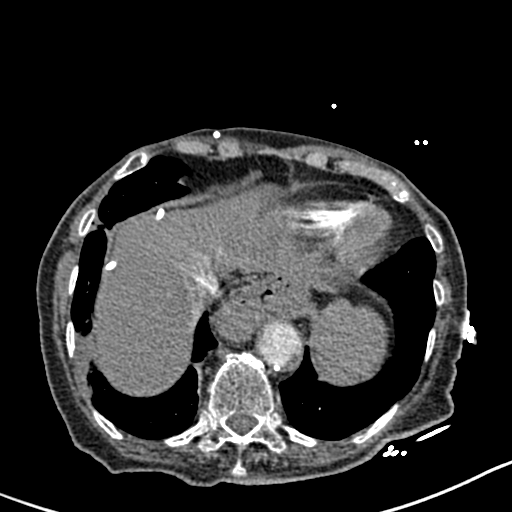
[im 104/357  lung]
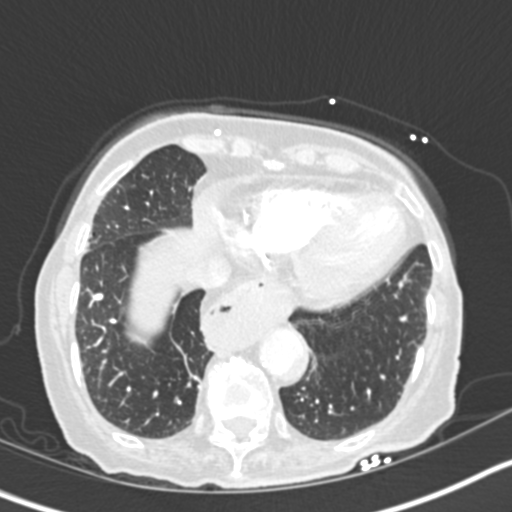
[im 134/357  soft-tissue]
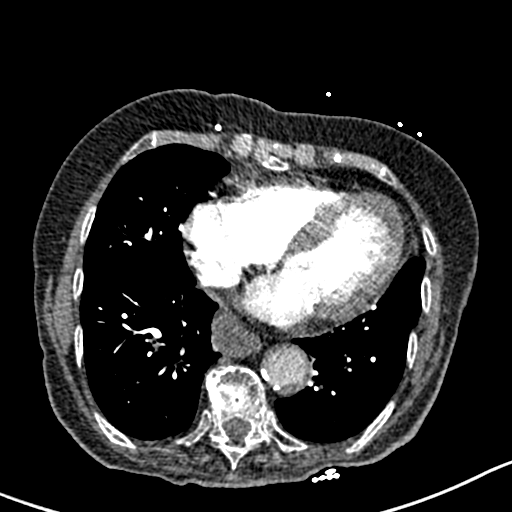
[im 149/357  lung]
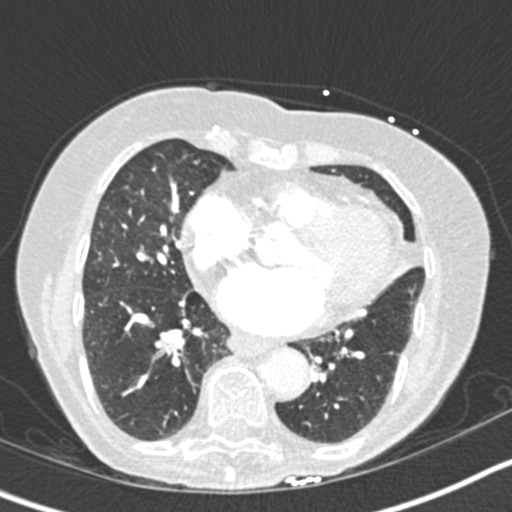
[im 179/357  soft-tissue]
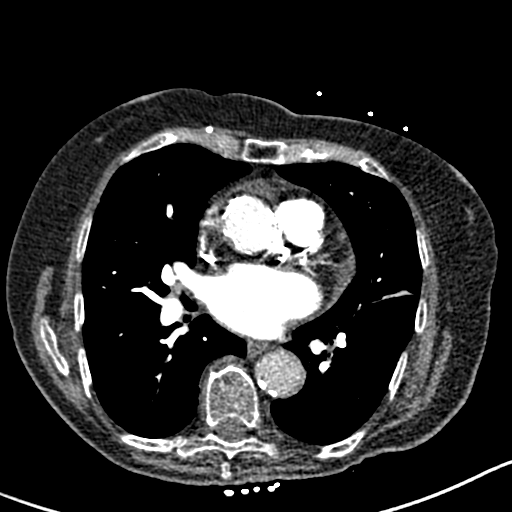
[im 208/357  lung]
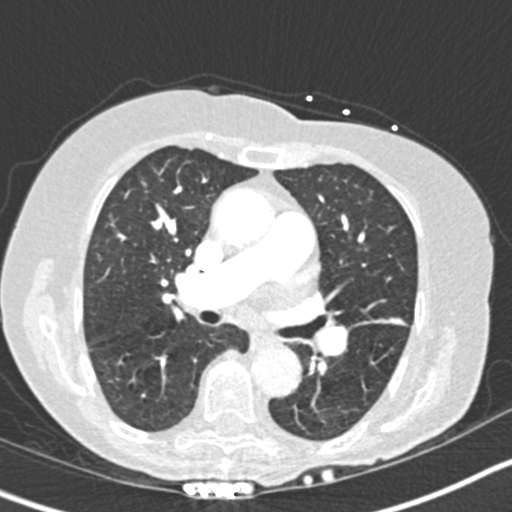
[im 223/357  soft-tissue]
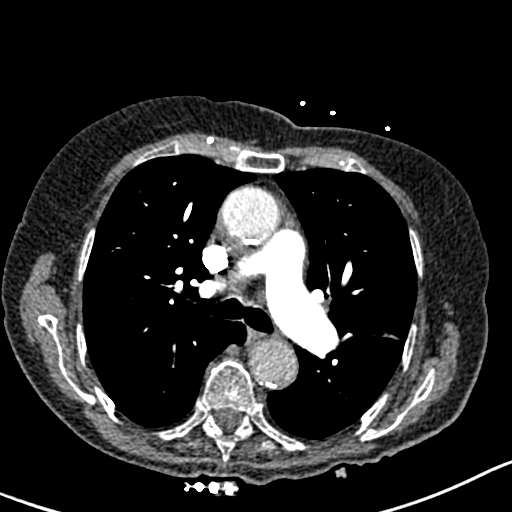
[im 253/357  lung]
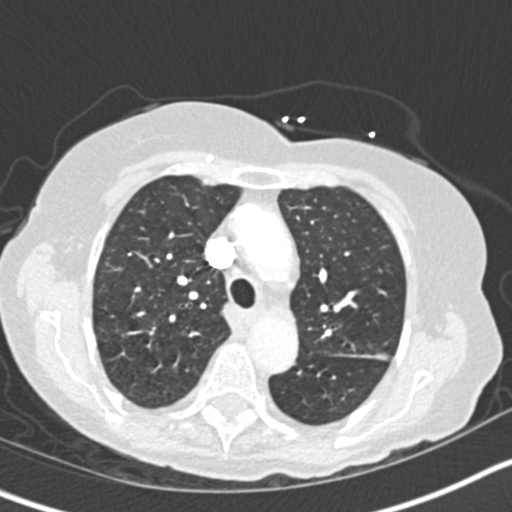
[im 268/357  soft-tissue]
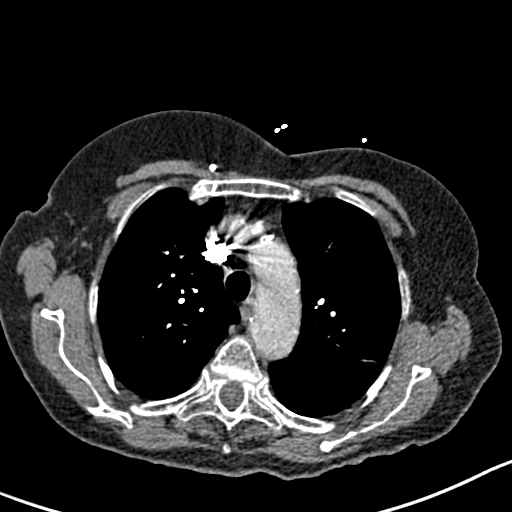
[im 297/357  lung]
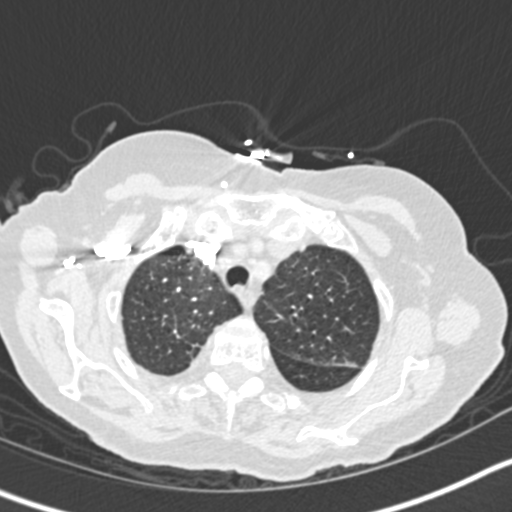
[im 312/357  soft-tissue]
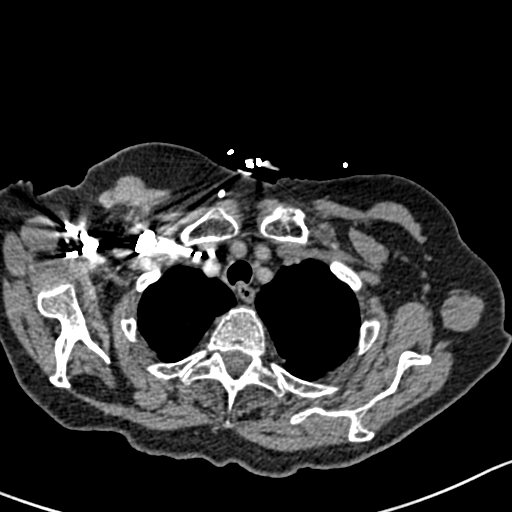
[im 342/357  lung]
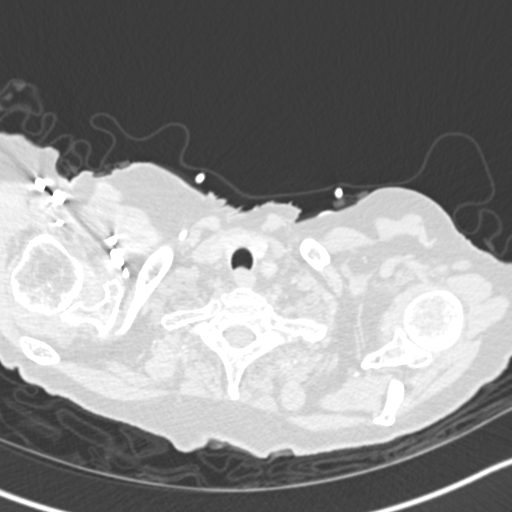

[Series 7: coronal mpr · coronal · 0.62mm/px · 3 of 80 slices shown]
[im 20/80  soft-tissue]
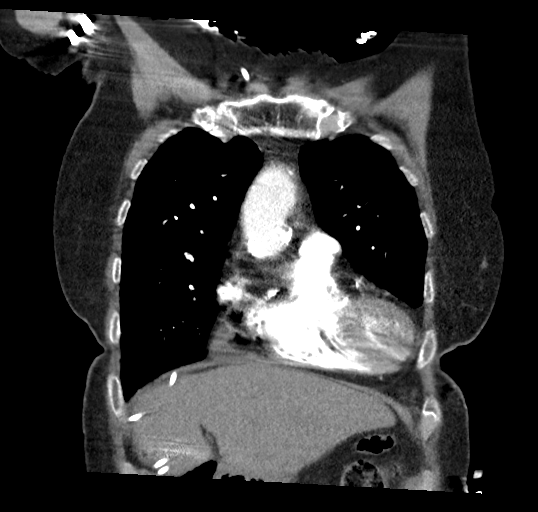
[im 40/80  soft-tissue]
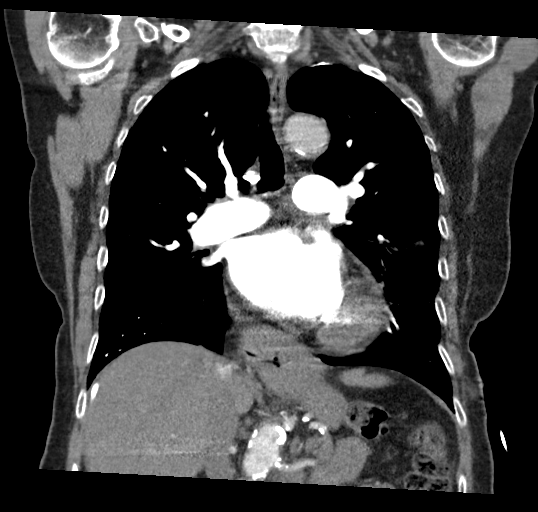
[im 60/80  soft-tissue]
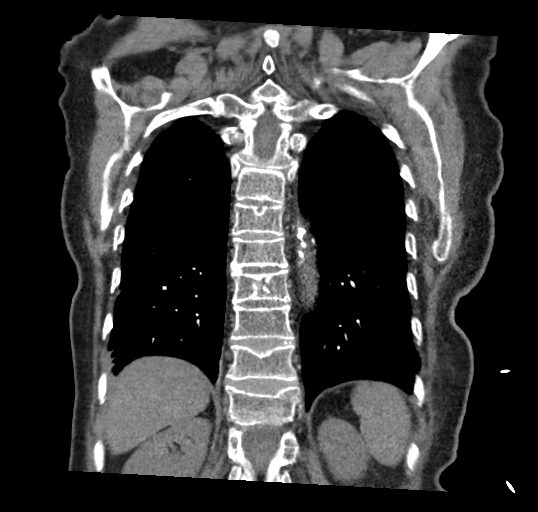

[18 of 46 positions shown; findings below may reference images not displayed]

FINDINGS: Cardiovascular: Atherosclerotic calcifications aorta, coronary
arteries and proximal great vessels. Aorta normal caliber without
aneurysm or dissection. Thrombus identified within descending
thoracic aorta. Pulmonary arteries well opacified. Single small
pulmonary embolus identified within a subsegmental RIGHT lower lobe
pulmonary artery. Remaining pulmonary arteries patent. Central
pulmonary arteries are borderline dilated. Minimal pericardial
fluid. Cardiac chambers unremarkable.

Mediastinum/Nodes: Large hiatal hernia. Esophagus unremarkable. Few
normal size mediastinal lymph nodes. No thoracic adenopathy.

Lungs/Pleura: Minimal subsegmental atelectasis at lung bases. Lungs
otherwise clear. No pulmonary infiltrate, pleural effusion, or
pneumothorax.

Upper Abdomen: VP shunt tubing traverses anterior RIGHT chest wall
into abdomen. Gallbladder surgically absent. Visualized upper
abdomen otherwise unremarkable

Musculoskeletal: Osseous demineralization.

Review of the MIP images confirms the above findings.
IMPRESSION: Single small pulmonary embolus in a subsegmental RIGHT lower lobe
pulmonary artery.

Minimal subsegmental atelectasis at lung bases.

Large hiatal hernia.

Aortic Atherosclerosis ([id number].A).

Findings called to Dr. Joinix on 04/02/2021 at 0302 hours.

## 2021-07-31 ENCOUNTER — Telehealth (INDEPENDENT_AMBULATORY_CARE_PROVIDER_SITE_OTHER): Payer: Self-pay

## 2021-07-31 NOTE — Telephone Encounter (Signed)
The ultrasound is to determine if her DVT is still present.  We can't tell otherwise.  As far as her Co-pay it covers her ultrasound AND her office visit.  If we cancel the ultrasound, she will still be charged a copay.  Therefore, it is best to go ahead and have her ultrasound so that we can best manage her DVT

## 2021-07-31 NOTE — Telephone Encounter (Signed)
The pt's daughter called about the pt's appointment on Oct 6th she has a U/S and OFV due to the amount of the pt's co-pay, unless the U/S is deemed neccessary her daughter would like to have it canceled ( U/S  only)  she would then like to know what time would she need to arrive for the appointment. The pt is having a 3 mo  LE venous DVT US. Please advise.

## 2021-07-31 NOTE — Telephone Encounter (Signed)
Daughter called back and I advised her of NP's note. Daughter decided to cancel appointment and will callback to r/s with GS when it is close to time for IVC filter to be removed.    This note is for documentation purposes only.

## 2021-08-02 ENCOUNTER — Encounter (INDEPENDENT_AMBULATORY_CARE_PROVIDER_SITE_OTHER): Payer: Medicare Other

## 2021-08-02 ENCOUNTER — Ambulatory Visit (INDEPENDENT_AMBULATORY_CARE_PROVIDER_SITE_OTHER): Payer: Medicare Other | Admitting: Nurse Practitioner

## 2021-08-15 ENCOUNTER — Other Ambulatory Visit: Payer: Medicare Other

## 2021-08-15 ENCOUNTER — Emergency Department

## 2021-08-15 ENCOUNTER — Other Ambulatory Visit: Payer: Self-pay

## 2021-08-15 ENCOUNTER — Encounter: Payer: Self-pay | Admitting: Emergency Medicine

## 2021-08-15 ENCOUNTER — Ambulatory Visit: Payer: Medicare Other | Admitting: Oncology

## 2021-08-15 ENCOUNTER — Emergency Department
Admission: EM | Admit: 2021-08-15 | Discharge: 2021-08-15 | Disposition: A | Discharge status: Home / Self Care | Attending: Emergency Medicine | Admitting: Emergency Medicine

## 2021-08-15 DIAGNOSIS — S70911A Unspecified superficial injury of right hip, initial encounter: Secondary | ICD-10-CM | POA: Diagnosis present

## 2021-08-15 DIAGNOSIS — I10 Essential (primary) hypertension: Secondary | ICD-10-CM | POA: Insufficient documentation

## 2021-08-15 DIAGNOSIS — S7001XA Contusion of right hip, initial encounter: Secondary | ICD-10-CM | POA: Diagnosis not present

## 2021-08-15 DIAGNOSIS — Z87891 Personal history of nicotine dependence: Secondary | ICD-10-CM | POA: Insufficient documentation

## 2021-08-15 DIAGNOSIS — W010XXA Fall on same level from slipping, tripping and stumbling without subsequent striking against object, initial encounter: Secondary | ICD-10-CM | POA: Diagnosis not present

## 2021-08-15 DIAGNOSIS — Z7901 Long term (current) use of anticoagulants: Secondary | ICD-10-CM | POA: Insufficient documentation

## 2021-08-15 DIAGNOSIS — S7000XA Contusion of unspecified hip, initial encounter: Secondary | ICD-10-CM

## 2021-08-15 DIAGNOSIS — W19XXXA Unspecified fall, initial encounter: Secondary | ICD-10-CM

## 2021-08-15 NOTE — ED Notes (Signed)
Pts daughter contacted and in route. Per daughter/POA, pt just went on Hospice and is only to have basic imaging if needed.

## 2021-08-15 NOTE — ED Triage Notes (Addendum)
Pt arrived via [facility] from [facility] where she had a unwitnessed fall. EMS found pt sitting at door. No obvious injury noted. Pt on Eliquis. Staff at facility requested basic imaging, no MRI. Pt c/o lower back pain. Hx/o dementia. Under Hospice care. Pt oxygen 88% RA, placed on 2L nasal cannula.

## 2021-08-15 NOTE — Discharge Instructions (Addendum)
Follow-up with your regular doctor as needed.  Return if worsening. ?

## 2021-08-15 NOTE — ED Provider Notes (Signed)
[facility] Emergency Department Provider Note  ____________________________________________   Event Date/Time   First MD Initiated Contact with Patient 08/15/21 [phone]     (approximate)  I have reviewed the triage vital signs and the nursing notes.   HISTORY  Chief Complaint Fall    HPI Anne Harrison is a 87 y.o. female presents emergency department from [facility] after a fall.  Family is insistent that we only do a x-ray of the hip.  They states she is on hospice care and they do not want [place] to do extensive imaging.  Patient does have history of dementia.  Past Medical History:  Diagnosis Date   Bronchitis, not specified as acute or chronic    Essential (primary) hypertension    Other emphysema (HCC)    Tinea unguium    Urge incontinence     Patient Active Problem List   Diagnosis Date Noted   Chronic anticoagulation 05/11/2021   Presence of IVC filter 05/11/2021   Weakness    Recurrent deep vein thrombosis (DVT) of right lower extremity (HCC) 04/18/2021   Pulmonary embolism (HCC) 04/02/2021   DVT (deep venous thrombosis) (HCC)_right leg 04/02/2021   COVID-19 virus infection 04/02/2021   Chronic obstructive pulmonary disease (HCC)    Bronchitis 10/13/2018   Cough 10/13/2018   Recurrent UTI 08/05/2018   Urge incontinence of urine 08/05/2018   Onychomycosis 10/22/2017   Essential hypertension 10/07/2017    Past Surgical History:  Procedure Laterality Date   ABDOMINAL HYSTERECTOMY     CHOLECYSTECTOMY     IVC FILTER INSERTION N/A 04/18/2021   Procedure: IVC FILTER INSERTION;  Surgeon: Annice Needy, MD;  Location: [facility] INVASIVE CV LAB;  Service: Cardiovascular;  Laterality: N/A;   PERIPHERAL VASCULAR THROMBECTOMY Right 04/03/2021   Procedure: PERIPHERAL VASCULAR THROMBECTOMY;  Surgeon: Renford Dills, MD;  Location: [facility] INVASIVE CV LAB;  Service: Cardiovascular;  Laterality: Right;    Prior to Admission medications   Medication  Sig Start Date End Date Taking? Authorizing Provider  acetaminophen (TYLENOL) 325 MG tablet Take 325 mg by mouth every 4 (four) hours as needed for mild pain.    [provider]  albuterol (VENTOLIN HFA) 108 (90 Base) MCG/ACT inhaler Inhale 1 puff into the lungs 2 (two) times daily.    [provider]  amLODipine (NORVASC) 5 MG tablet Take 5 mg by mouth daily.     [provider]  apixaban (ELIQUIS) 5 MG TABS tablet Take 1 tablet (5 mg total) by mouth 2 (two) times daily. 05/28/21   Schnier, Latina Craver, MD  ascorbic acid (VITAMIN C) 500 MG tablet Take 1,000 mg by mouth daily.    [provider]  benzocaine (ORAJEL) 10 % mucosal gel Use as directed 1 application in the mouth or throat every 4 (four) hours as needed for mouth pain.    [provider]  Calcium Carb-Cholecalciferol (OYSTER SHELL CALCIUM) 500-400 MG-UNIT TABS Take 1 tablet by mouth daily.    [provider]  metoprolol succinate (TOPROL-XL) 25 MG 24 hr tablet Take 25 mg by mouth at bedtime.     [provider]  potassium chloride SA (KLOR-CON) 20 MEQ tablet Take 20 mEq by mouth daily.    [provider]    Allergies Ciprofloxacin  Family History  Problem Relation Age of Onset   Hypertension Mother    Heart attack Father     Social History Social History   Tobacco Use   Smoking status: Former  Smokeless tobacco: Never  Vaping Use   Vaping Use: Never used  Substance Use Topics   Alcohol use: Not Currently    Alcohol/week: 1.0 - 2.0 standard drink    Types: 1 - 2 Glasses of wine per week    Comment: 1-2 glasses of wine per day   Drug use: No    Review of Systems  Constitutional: No fever/chills Eyes: No visual changes. ENT: No sore throat. Respiratory: Denies cough Cardiovascular: Denies chest pain Gastrointestinal: Denies abdominal pain Genitourinary: Negative for dysuria. Musculoskeletal: Negative for back pain. Skin: Negative for  rash. Psychiatric: no mood changes,     ____________________________________________   PHYSICAL EXAM:  VITAL SIGNS: ED Triage Vitals  Enc Vitals Group     BP 08/15/21 0654 (!) 158/68     Pulse Rate 08/15/21 0654 71     Resp 08/15/21 0654 20     Temp 08/15/21 0654 98 F (36.7 C)     Temp Source 08/15/21 0654 Oral     SpO2 08/15/21 0654 (!) 88 %     Weight 08/15/21 0853 111 lb 1.8 oz (50.4 kg)     Height 08/15/21 0853 4\' 11"  (1.499 m)     Head Circumference --      Peak Flow --      Pain Score --      Pain Loc --      Pain Edu? --      Excl. in GC? --     Constitutional: Alert and oriented. Well appearing and in no acute distress. Eyes: Conjunctivae are normal.  Head: Atraumatic. Nose: No congestion/rhinnorhea. Mouth/Throat: Mucous membranes are moist.   Neck:  supple no lymphadenopathy noted Cardiovascular: Normal rate, regular rhythm. Heart sounds are normal Respiratory: Normal respiratory effort.  No retractions, lungs c t a  Abd: soft nontender bs normal all 4 quad GU: deferred Musculoskeletal: FROM all extremities, warm and well perfused, left and right hip is mildly tender to palpation, neurovascular intact Neurologic:  Normal speech and language.  Skin:  Skin is warm, dry and intact. No rash noted. Psychiatric: Mood and affect are normal. Speech and behavior are normal for patient's baseline  ____________________________________________   LABS (all labs ordered are listed, but only abnormal results are displayed)  Labs Reviewed - No data to display ____________________________________________   ____________________________________________  RADIOLOGY  X-ray of the right hip  ____________________________________________   PROCEDURES  Procedure(s) performed: No  Procedures    ____________________________________________   INITIAL IMPRESSION / ASSESSMENT AND PLAN / ED COURSE  Pertinent labs & imaging results that were available during my  care of the patient were reviewed by me and considered in my medical decision making (see chart for details).   The patient is 87 year old female presents with a fall.  See HPI.  Physical exam shows patient be stable.  X-ray of the right hip reviewed by me confirmed by radiology to be negative.  In discussion with the family they do not want any further imaging as patient has dementia and is on hospice care at this time.  I am in agreement with the family that comfort measures should be addressed.  X-ray of the right hip was reviewed by me and confirmed by radiology to be negative for fracture.  Patient was discharged in the care of her family member in stable condition.     Anne Harrison was evaluated in Emergency Department on 08/15/2021 for the symptoms described in the history of present illness. She was evaluated in  the context of the global COVID-19 pandemic, which necessitated consideration that the patient might be at risk for infection with the SARS-CoV-2 virus that causes COVID-19. Institutional protocols and algorithms that pertain to the evaluation of patients at risk for COVID-19 are in a state of rapid change based on information released by regulatory bodies including the CDC and federal and state organizations. These policies and algorithms were followed during the patient's care in the ED.    As part of my medical decision making, I reviewed the following data within the electronic MEDICAL RECORD NUMBER History obtained from family, Nursing notes reviewed and incorporated, Old chart reviewed, Radiograph reviewed , Notes from prior ED visits, and [place] Controlled Substance Database  ____________________________________________   FINAL CLINICAL IMPRESSION(S) / ED DIAGNOSES  Final diagnoses:  Fall, initial encounter  Contusion of hip, unspecified laterality, initial encounter      NEW MEDICATIONS STARTED DURING THIS VISIT:  Discharge Medication List as of 08/15/2021 10:14 AM        Note:  This document was prepared using Dragon voice recognition software and may include unintentional dictation errors.    Faythe Ghee, PA-C 08/15/21 1247    Shaune Pollack, MD 08/15/21 [phone]

## 2021-08-15 NOTE — ED Notes (Signed)
Pt refusing CT head and C-spine.  Pt is currently a hospice patient, and family only wants a DG of right hip completed.  Once results come back, family will self-transport back to facility.

## 2021-08-22 ENCOUNTER — Ambulatory Visit: Payer: Medicare Other | Admitting: Oncology

## 2021-08-22 ENCOUNTER — Other Ambulatory Visit: Payer: Medicare Other

## 2021-08-29 ENCOUNTER — Other Ambulatory Visit: Payer: Medicare Other

## 2021-08-29 ENCOUNTER — Ambulatory Visit: Payer: Medicare Other | Admitting: Oncology

## 2021-12-26 DEATH — deceased
